# Patient Record
Sex: Male | Born: 1965 | Race: White | Hispanic: No | Marital: Married | Smoking: Never smoker
Health system: Southern US, Community
[De-identification: ages and names within clinical notes are randomized; demographics above are authoritative.]

## PROBLEM LIST (undated history)

## (undated) HISTORY — PX: OTHER SURGICAL HISTORY: SHX169

---

## 1998-07-15 ENCOUNTER — Ambulatory Visit (HOSPITAL_BASED_OUTPATIENT_CLINIC_OR_DEPARTMENT_OTHER): Admission: RE | Admit: 1998-07-15 | Discharge: 1998-07-15 | Payer: Self-pay | Admitting: Orthopedic Surgery

## 2007-05-23 ENCOUNTER — Ambulatory Visit: Payer: Self-pay | Admitting: Internal Medicine

## 2007-09-27 ENCOUNTER — Ambulatory Visit: Payer: Self-pay | Admitting: Internal Medicine

## 2008-06-27 ENCOUNTER — Ambulatory Visit: Payer: Self-pay | Admitting: Internal Medicine

## 2008-09-17 ENCOUNTER — Ambulatory Visit: Payer: Self-pay | Admitting: Internal Medicine

## 2008-12-01 ENCOUNTER — Ambulatory Visit: Payer: Self-pay | Admitting: Internal Medicine

## 2009-10-21 ENCOUNTER — Ambulatory Visit: Payer: Self-pay | Admitting: Family Medicine

## 2011-10-13 ENCOUNTER — Ambulatory Visit: Payer: Self-pay | Admitting: Internal Medicine

## 2012-05-30 DIAGNOSIS — G8929 Other chronic pain: Secondary | ICD-10-CM

## 2012-05-30 DIAGNOSIS — M549 Dorsalgia, unspecified: Secondary | ICD-10-CM | POA: Insufficient documentation

## 2012-12-04 DIAGNOSIS — Z79891 Long term (current) use of opiate analgesic: Secondary | ICD-10-CM | POA: Insufficient documentation

## 2013-04-29 DIAGNOSIS — E7800 Pure hypercholesterolemia, unspecified: Secondary | ICD-10-CM | POA: Insufficient documentation

## 2013-05-16 DIAGNOSIS — IMO0001 Reserved for inherently not codable concepts without codable children: Secondary | ICD-10-CM | POA: Insufficient documentation

## 2013-05-16 DIAGNOSIS — Z0189 Encounter for other specified special examinations: Secondary | ICD-10-CM | POA: Insufficient documentation

## 2014-06-28 ENCOUNTER — Ambulatory Visit: Payer: Self-pay | Admitting: Family Medicine

## 2015-04-08 ENCOUNTER — Other Ambulatory Visit: Payer: Self-pay | Admitting: *Deleted

## 2015-04-08 ENCOUNTER — Ambulatory Visit (INDEPENDENT_AMBULATORY_CARE_PROVIDER_SITE_OTHER): Payer: 59

## 2015-04-08 ENCOUNTER — Encounter: Payer: Self-pay | Admitting: Podiatry

## 2015-04-08 ENCOUNTER — Encounter: Payer: Self-pay | Admitting: *Deleted

## 2015-04-08 ENCOUNTER — Ambulatory Visit (INDEPENDENT_AMBULATORY_CARE_PROVIDER_SITE_OTHER): Payer: 59 | Admitting: Podiatry

## 2015-04-08 VITALS — BP 139/80 | HR 70 | Resp 16 | Ht 70.0 in | Wt 174.0 lb

## 2015-04-08 DIAGNOSIS — M722 Plantar fascial fibromatosis: Secondary | ICD-10-CM | POA: Diagnosis not present

## 2015-04-08 MED ORDER — MELOXICAM 15 MG PO TABS: 15.0000 mg | ORAL_TABLET | Freq: Every day | ORAL | Status: DC

## 2015-04-08 MED ORDER — METHYLPREDNISOLONE 4 MG PO TBPK: ORAL_TABLET | ORAL | Status: DC

## 2015-04-08 NOTE — Progress Notes (Signed)
   Subjective:    Patient ID: Austin Crawford, male    DOB: Jul 04, 1966, 49 y.o.   MRN: [id number]  HPI Been having pain in both my feet for a few months now. They just stay real sore   Review of Systems     Objective:   Physical Exam: I have reviewed his past medical history medications allergies surgery social history and review of systems. Pulses are strongly palpable bilateral. Neurologic sensorium is intact per Semmes-Weinstein monofilament. Deep tendon reflexes are intact bilateral and muscle strength +5 over 5 dorsiflexors plantarflexion inversion everters all the musculature is intact. Orthopedic evaluation demonstrates all joints distal to the ankle for range of motion without crepitation. He has pain on palpation medial calcaneal tubercles bilateral. Radiographs confirm soft tissue increase in density at the plantar fascial calcaneal insertion site otherwise they appear unremarkable.        Assessment & Plan:  Assessment: Plantar fasciitis chronic in nature bilateral.  Plan: Injected the bilateral heels today with Kenalog and local anesthesia. We discussed the etiology pathology conservative versus surgical therapies. We also discussed appropriate shoe gear stretching exercises ice therapy and shoe gear modifications. We dispensed a prescription for Medrol Dosepak to be followed by meloxicam. We also dispensed to plantar fascial braces and a single night splint. He was scant before he left the office today for orthotics. I will follow up with him once his orthotics come in. He will call sooner if needed.

## 2015-04-08 NOTE — Patient Instructions (Signed)

## 2015-05-11 ENCOUNTER — Ambulatory Visit: Payer: 59 | Admitting: Podiatry

## 2015-05-18 ENCOUNTER — Ambulatory Visit (INDEPENDENT_AMBULATORY_CARE_PROVIDER_SITE_OTHER): Payer: 59 | Admitting: Podiatry

## 2015-05-18 ENCOUNTER — Encounter: Payer: Self-pay | Admitting: Podiatry

## 2015-05-18 VITALS — BP 122/81 | HR 67 | Resp 16

## 2015-05-18 DIAGNOSIS — M722 Plantar fascial fibromatosis: Secondary | ICD-10-CM | POA: Diagnosis not present

## 2015-05-18 NOTE — Progress Notes (Signed)
He presents today for follow-up of his plantar fasciitis. He states that he is approximately 80% improved. He states that they are still painful at some point during the day but nothing like they were. He even relates going to the [facility] and walking in sand without any problems. He also would like to pick up his orthotics today.  Objective: Vital signs are stable alert and oriented 3. Pulses are palpable bilateral. He has minimal to no pain on palpation medial calcaneal tubercles bilateral.  Assessment: Well-healing plantar fasciitis 80% improved bilateral foot.  Plan: Dispensed orthotics today suggest that he continue all conservative therapies at the completely well follow up with me in 1 month to reevaluate her orthotics.

## 2015-05-18 NOTE — Patient Instructions (Signed)

## 2015-07-01 ENCOUNTER — Ambulatory Visit (INDEPENDENT_AMBULATORY_CARE_PROVIDER_SITE_OTHER): Payer: Managed Care, Other (non HMO) | Admitting: Podiatry

## 2015-07-01 DIAGNOSIS — M722 Plantar fascial fibromatosis: Secondary | ICD-10-CM

## 2015-07-01 NOTE — Progress Notes (Signed)
He presents today for follow-up of his orthotics in his plantar fasciitis. He states that the plantar fasciitis has completely resolved. He states that he is still having a hard time wearing the orthotics. He states that the right one seems to be bothering him the most. He has not worn them one full day since he purchased them.  Objective: Vital signs are stable he is alert and oriented 3 no pain on palpation medial calcaneal tubercles bilateral pain and pulses remain palpable. Evaluation of the orthotics demonstrates that they appear to be spot on without any adjustment needed.  Assessment: Well-healing plantar fasciitis.  Plan: I encouraged him to try to wear the orthotics on a regular basis. I suggested that he wear them in his tennis shoes every day particularly when he gets home from work. I will follow-up with him in a month if necessary.

## 2015-08-05 ENCOUNTER — Ambulatory Visit: Payer: Managed Care, Other (non HMO) | Admitting: Podiatry

## 2015-09-07 ENCOUNTER — Ambulatory Visit (INDEPENDENT_AMBULATORY_CARE_PROVIDER_SITE_OTHER): Payer: Managed Care, Other (non HMO) | Admitting: Podiatry

## 2015-09-07 ENCOUNTER — Encounter: Payer: Self-pay | Admitting: Podiatry

## 2015-09-07 VITALS — BP 133/87 | HR 73 | Resp 16

## 2015-09-07 DIAGNOSIS — M722 Plantar fascial fibromatosis: Secondary | ICD-10-CM | POA: Diagnosis not present

## 2015-09-07 NOTE — Progress Notes (Signed)
He presents today for follow-up of plantar fasciitis to his left heel. He states that he has had 1 injection bilateral in his right foot is fine his left foot is still painful with numbness and tingling to the forefoot.  Objective: Vital signs are stable he is alert and oriented 3. Pulses are palpable. He has pain on palpation medial calcaneal tubercle of the left heel.  Assessment: Plantar fasciitis left heel resolved plantar fasciitis right heel.  Plan: I reinjected today with Kenalog and local anesthetic and I will follow-up with him in 1 month at which time we may need to consider physical therapy.

## 2015-10-07 ENCOUNTER — Ambulatory Visit: Payer: Managed Care, Other (non HMO) | Admitting: Podiatry

## 2017-03-13 ENCOUNTER — Ambulatory Visit
Admission: EM | Admit: 2017-03-13 | Discharge: 2017-03-13 | Disposition: A | Discharge status: Home / Self Care | Payer: Commercial Managed Care - PPO | Attending: Family Medicine | Admitting: Family Medicine

## 2017-03-13 DIAGNOSIS — S46911A Strain of unspecified muscle, fascia and tendon at shoulder and upper arm level, right arm, initial encounter: Secondary | ICD-10-CM

## 2017-03-13 MED ORDER — HYDROCODONE-ACETAMINOPHEN 5-325 MG PO TABS: ORAL_TABLET | ORAL | 0 refills | Status: DC

## 2017-03-13 MED ORDER — IBUPROFEN 800 MG PO TABS: 800.0000 mg | ORAL_TABLET | Freq: Three times a day (TID) | ORAL | 0 refills | Status: AC

## 2017-03-13 NOTE — ED Triage Notes (Addendum)
Pt c/o right shoulder pain, he was carrying a bag of cement today and he dropped it and went to catch it and pulled something in his shoulder. Not really in pain but feels like something is misplaced when he lifts it.

## 2017-03-13 NOTE — ED Provider Notes (Signed)
[facility]    CSN: [id number] Arrival date & time: 03/13/17  1440     History   Chief Complaint Chief Complaint  Patient presents with  . Shoulder Pain    HPI Austin Crawford is a 51 y.o. male.   51 yo male with a c/o right shoulder and biceps pain after injuring it today. States went to catch a bag of cement when he felt something pull and pain.    The history is provided by the patient.  Shoulder Pain    History reviewed. No pertinent past medical history.  Patient Active Problem List   Diagnosis Date Noted  . Diagnostic skin and sensitization tests 05/16/2013  . Hypercholesteremia 04/29/2013  . Long term current use of opiate analgesic 12/04/2012  . Back pain, chronic 05/30/2012    Past Surgical History:  Procedure Laterality Date  . shoudler Left        Home Medications    Prior to Admission medications   Medication Sig Start Date End Date Taking? Authorizing Provider  HYDROcodone-acetaminophen (NORCO/VICODIN) 5-325 MG tablet 1-2 tabs po bid prn 03/13/17   Norval Gable, MD  ibuprofen (ADVIL,MOTRIN) 800 MG tablet Take 1 tablet (800 mg total) by mouth 3 (three) times daily. 03/13/17   Norval Gable, MD    Family History History reviewed. No pertinent family history.  Social History Social History  Substance Use Topics  . Smoking status: Never Smoker  . Smokeless tobacco: Never Used  . Alcohol use No     Allergies   Patient has no known allergies.   Review of Systems Review of Systems   Physical Exam Triage Vital Signs ED Triage Vitals  Enc Vitals Group     BP 03/13/17 1529 (!) 139/93     Pulse Rate 03/13/17 1529 81     Resp 03/13/17 1529 18     Temp 03/13/17 1529 98.2 F (36.8 C)     Temp Source 03/13/17 1529 Oral     SpO2 03/13/17 1529 99 %     Weight 03/13/17 1530 175 lb (79.4 kg)     Height 03/13/17 1530 5\' 10"  (1.778 m)     Head Circumference --      Peak Flow --      Pain Score 03/13/17 1530 0     Pain Loc  --      Pain Edu? --      Excl. in [place]? --    No data found.   Updated Vital Signs BP (!) 139/93 (BP Location: Left Arm)   Pulse 81   Temp 98.2 F (36.8 C) (Oral)   Resp 18   Ht 5\' 10"  (1.778 m)   Wt 175 lb (79.4 kg)   SpO2 99%   BMI 25.11 kg/m   Visual Acuity Right Eye Distance:   Left Eye Distance:   Bilateral Distance:    Right Eye Near:   Left Eye Near:    Bilateral Near:     Physical Exam  Constitutional: He appears well-developed and well-nourished. No distress.  Musculoskeletal:       Right shoulder: He exhibits tenderness (over the deltoid and biceps muscles). He exhibits normal range of motion, no bony tenderness, no swelling, no effusion, no crepitus, no deformity, no laceration, no spasm, normal pulse and normal strength.  Right upper extremity neurovascularly intact  Skin: He is not diaphoretic.  Vitals reviewed.    UC Treatments / Results  Labs (all labs ordered are listed, but only abnormal  results are displayed) Labs Reviewed - No data to display  EKG  EKG Interpretation None       Radiology No results found.  Procedures Procedures (including critical care time)  Medications Ordered in UC Medications - No data to display   Initial Impression / Assessment and Plan / UC Course  I have reviewed the triage vital signs and the nursing notes.  Pertinent labs & imaging results that were available during my care of the patient were reviewed by me and considered in my medical decision making (see chart for details).       Final Clinical Impressions(s) / UC Diagnoses   Final diagnoses:  Strain of right shoulder, initial encounter    New Prescriptions Discharge Medication List as of 03/13/2017  4:34 PM    START taking these medications   Details  HYDROcodone-acetaminophen (NORCO/VICODIN) 5-325 MG tablet 1-2 tabs po bid prn, Print       1. diagnosis reviewed with patient 2. rx as per orders above; reviewed possible side effects,  interactions, risks and benefits  3. Recommend supportive treatment with rest, ice, elevation, over the counter analgesics  4. Follow-up prn if symptoms worsen or don't improve   Norval Gable, MD 03/13/17 1721

## 2017-03-27 ENCOUNTER — Other Ambulatory Visit: Payer: Self-pay | Admitting: Orthopedic Surgery

## 2017-03-27 DIAGNOSIS — M25512 Pain in left shoulder: Secondary | ICD-10-CM

## 2017-03-29 ENCOUNTER — Ambulatory Visit
Admission: RE | Admit: 2017-03-29 | Discharge: 2017-03-29 | Disposition: A | Discharge status: Home / Self Care | Payer: Commercial Managed Care - PPO | Source: Ambulatory Visit | Attending: Orthopedic Surgery | Admitting: Orthopedic Surgery

## 2017-03-29 ENCOUNTER — Other Ambulatory Visit: Payer: Self-pay | Admitting: Orthopedic Surgery

## 2017-03-29 DIAGNOSIS — M25512 Pain in left shoulder: Secondary | ICD-10-CM

## 2017-03-29 DIAGNOSIS — M25511 Pain in right shoulder: Secondary | ICD-10-CM

## 2017-05-12 ENCOUNTER — Ambulatory Visit
Admission: EM | Admit: 2017-05-12 | Discharge: 2017-05-12 | Disposition: A | Discharge status: Home / Self Care | Payer: Commercial Managed Care - PPO | Attending: Family Medicine | Admitting: Family Medicine

## 2017-05-12 ENCOUNTER — Encounter: Payer: Self-pay | Admitting: *Deleted

## 2017-05-12 DIAGNOSIS — K644 Residual hemorrhoidal skin tags: Secondary | ICD-10-CM

## 2017-05-12 DIAGNOSIS — K648 Other hemorrhoids: Secondary | ICD-10-CM

## 2017-05-12 MED ORDER — TRIAMCINOLONE ACETONIDE 0.1 % EX CREA: 1.0000 "application " | TOPICAL_CREAM | Freq: Two times a day (BID) | CUTANEOUS | 0 refills | Status: AC

## 2017-05-12 MED ORDER — PREDNISONE 20 MG PO TABS: 20.0000 mg | ORAL_TABLET | Freq: Every day | ORAL | 0 refills | Status: DC

## 2017-05-12 NOTE — ED Provider Notes (Signed)
[facility]    CSN: [id number] Arrival date & time: 05/12/17  1218     History   Chief Complaint Chief Complaint  Patient presents with  . Hemorrhoids    HPI Austin Crawford is a 51 y.o. male.   52 yo male with a c/o painful hemorrhoid for one week, worse over the last 2 days. States has recently been taking pain medication intermittently during PT for his shoulder. Patient had recent shoulder surgery.    The history is provided by the patient.    History reviewed. No pertinent past medical history.  Patient Active Problem List   Diagnosis Date Noted  . Diagnostic skin and sensitization tests 05/16/2013  . Hypercholesteremia 04/29/2013  . Long term current use of opiate analgesic 12/04/2012  . Back pain, chronic 05/30/2012    Past Surgical History:  Procedure Laterality Date  . shoudler Left        Home Medications    Prior to Admission medications   Medication Sig Start Date End Date Taking? Authorizing Provider  HYDROcodone-acetaminophen (NORCO/VICODIN) 5-325 MG tablet 1-2 tabs po bid prn 03/13/17   Norval Gable, MD  ibuprofen (ADVIL,MOTRIN) 800 MG tablet Take 1 tablet (800 mg total) by mouth 3 (three) times daily. 03/13/17   Norval Gable, MD  predniSONE (DELTASONE) 20 MG tablet Take 1 tablet (20 mg total) by mouth daily. 05/12/17   Norval Gable, MD  triamcinolone cream (KENALOG) 0.1 % Apply 1 application topically 2 (two) times daily. 05/12/17   Norval Gable, MD    Family History History reviewed. No pertinent family history.  Social History Social History  Substance Use Topics  . Smoking status: Never Smoker  . Smokeless tobacco: Never Used  . Alcohol use No     Allergies   Patient has no known allergies.   Review of Systems Review of Systems   Physical Exam Triage Vital Signs ED Triage Vitals  Enc Vitals Group     BP 05/12/17 1234 (!) 131/95     Pulse Rate 05/12/17 1234 85     Resp 05/12/17 1234 16     Temp 05/12/17  1234 98 F (36.7 C)     Temp Source 05/12/17 1234 Oral     SpO2 05/12/17 1234 100 %     Weight 05/12/17 1235 175 lb (79.4 kg)     Height 05/12/17 1235 5\' 8"  (1.727 m)     Head Circumference --      Peak Flow --      Pain Score 05/12/17 1236 7     Pain Loc --      Pain Edu? --      Excl. in [place]? --    No data found.   Updated Vital Signs BP (!) 131/95 (BP Location: Left Arm)   Pulse 85   Temp 98 F (36.7 C) (Oral)   Resp 16   Ht 5\' 8"  (1.727 m)   Wt 175 lb (79.4 kg)   SpO2 100%   BMI 26.61 kg/m   Visual Acuity Right Eye Distance:   Left Eye Distance:   Bilateral Distance:    Right Eye Near:   Left Eye Near:    Bilateral Near:     Physical Exam  Constitutional: He appears well-developed and well-nourished. No distress.  Abdominal: Soft. Bowel sounds are normal. He exhibits no distension and no mass. There is no tenderness. There is no rebound and no guarding. No hernia.  Genitourinary: Rectal exam shows external hemorrhoid (inflamed  external hemorrhoid).  Skin: He is not diaphoretic.  Nursing note and vitals reviewed.    UC Treatments / Results  Labs (all labs ordered are listed, but only abnormal results are displayed) Labs Reviewed - No data to display  EKG  EKG Interpretation None       Radiology No results found.  Procedures Procedures (including critical care time)  Medications Ordered in UC Medications - No data to display   Initial Impression / Assessment and Plan / UC Course  I have reviewed the triage vital signs and the nursing notes.  Pertinent labs & imaging results that were available during my care of the patient were reviewed by me and considered in my medical decision making (see chart for details).       Final Clinical Impressions(s) / UC Diagnoses   Final diagnoses:  External hemorrhoid    New Prescriptions Discharge Medication List as of 05/12/2017 12:59 PM    START taking these medications   Details  predniSONE  (DELTASONE) 20 MG tablet Take 1 tablet (20 mg total) by mouth daily., Starting Fri 05/12/2017, Normal    triamcinolone cream (KENALOG) 0.1 % Apply 1 application topically 2 (two) times daily., Starting Fri 05/12/2017, Normal       1. diagnosis reviewed with patient 2. rx as per orders above; reviewed possible side effects, interactions, risks and benefits  3. Recommend supportive treatment with sitz baths, pressure relief with donut cushion, increased water and fiber  4. Follow-up prn if symptoms worsen or don't improve   Norval Gable, MD 05/12/17 1345

## 2017-05-12 NOTE — ED Triage Notes (Signed)
Painful hemorrhoids x1 week.

## 2017-05-16 ENCOUNTER — Telehealth: Payer: Self-pay

## 2017-05-16 ENCOUNTER — Telehealth: Payer: Self-pay | Admitting: Emergency Medicine

## 2017-05-16 NOTE — Telephone Encounter (Signed)
Patient called in requesting GI referral. Advised patient he would need to established with a PCP for referral. Patient stated he would  give the healing process of his hemorrhoids  a couple more days before contacting [facility] to set up appointment.

## 2017-07-12 ENCOUNTER — Encounter: Payer: Self-pay | Admitting: Podiatry

## 2017-07-12 ENCOUNTER — Ambulatory Visit (INDEPENDENT_AMBULATORY_CARE_PROVIDER_SITE_OTHER): Payer: Commercial Managed Care - PPO

## 2017-07-12 ENCOUNTER — Ambulatory Visit (INDEPENDENT_AMBULATORY_CARE_PROVIDER_SITE_OTHER): Payer: Commercial Managed Care - PPO | Admitting: Podiatry

## 2017-07-12 DIAGNOSIS — M722 Plantar fascial fibromatosis: Secondary | ICD-10-CM

## 2017-07-12 MED ORDER — METHYLPREDNISOLONE 4 MG PO TBPK: ORAL_TABLET | ORAL | 0 refills | Status: DC

## 2017-07-12 MED ORDER — MELOXICAM 15 MG PO TABS: 15.0000 mg | ORAL_TABLET | Freq: Every day | ORAL | 3 refills | Status: DC

## 2017-07-13 NOTE — Progress Notes (Signed)
He presents today after having not seen him for the past couple of years with a chief complaint of pain to his right heel. He states this heel has been hurt me so bad again for the past 2 weeks. He denies any trauma.  Objective: I have reviewed his past medical history medications allergy surgery social history and review of systems. Pulses are strongly palpable vital signs are stable he is alert and oriented 3. He is in no apparent distress. Pulses are strongly palpable neurologic sensorium is intact deep tendon reflexes are intact muscle strength is symmetrical equal bilateral. He has no pain on medial and lateral compression of the calcaneus that he does have pain on direct palpation medial calcaneal tubercle of the right heel which is exquisitely tender in nature. Radiographs taken in the office today 3 views right foot demonstrates an osseous immature individual with no significant osseous findings other than a soft tissue increase in density at the plantar fascial calcaneal insertion site.  Assessment: Moderate severe plantar fasciitis right heel.  Plan: Discussed etiology pathology conservative versus surgical therapies. At this point injected his right heel today placed in a plantar fascial brace and a night splint. Discussed appropriate shoe gear stretching exercises ice therapy and shoe gear modifications. I also started him on a Medrol Dosepak to be followed by meloxicam. I will follow-up with him in 1 month should he have questions or concerns he will notify us immediately.

## 2017-07-14 ENCOUNTER — Telehealth: Payer: Self-pay | Admitting: *Deleted

## 2017-07-14 NOTE — Telephone Encounter (Signed)
Pt left name and birthdate. 07/14/2017-I spoke with pt and he said someone called "it" in. I also gave pt ice instructions 3-4 times a day for 15-20 minutes protecting skin from the ice pack with fabric that would help with inflammation, pain and swelling.

## 2017-08-02 ENCOUNTER — Ambulatory Visit (INDEPENDENT_AMBULATORY_CARE_PROVIDER_SITE_OTHER): Payer: Commercial Managed Care - PPO | Admitting: Podiatry

## 2017-08-02 ENCOUNTER — Encounter: Payer: Self-pay | Admitting: Podiatry

## 2017-08-02 DIAGNOSIS — M722 Plantar fascial fibromatosis: Secondary | ICD-10-CM | POA: Diagnosis not present

## 2017-08-02 MED ORDER — DICLOFENAC SODIUM 75 MG PO TBEC: 75.0000 mg | DELAYED_RELEASE_TABLET | Freq: Two times a day (BID) | ORAL | 3 refills | Status: AC

## 2017-08-02 NOTE — Progress Notes (Signed)
He presents today for follow-up of his plantar fasciitis bilaterally. States that the right heel something bothering him. He states his Y view 60% improved. Not sure whether meloxicam is helping or not.  Objective: Vital signs are stable he is alert and oriented 3 pulses are palpable. Pain on palpation medial calcaneal tubercle of the right heel.  Assessment: Resolving plantar fasciitis right.  Plan: Reinjected the right heel today started him on diclofenac 75 mg 1 by mouth twice a day 60 #3 refills. Follow-up with in one month continue her orthotics regularly. Discussed prep issue yesterday size ice therapy shoe gear modifications and new boots.

## 2017-09-04 ENCOUNTER — Ambulatory Visit: Payer: Commercial Managed Care - PPO | Admitting: Podiatry

## 2017-09-04 ENCOUNTER — Encounter: Payer: Self-pay | Admitting: Podiatry

## 2017-09-04 DIAGNOSIS — M722 Plantar fascial fibromatosis: Secondary | ICD-10-CM | POA: Diagnosis not present

## 2017-09-04 NOTE — Progress Notes (Signed)
He presents today for follow-up of his plantar fasciitis. He states that his right heel still really hurts.  Objective: Vital signs are stable he is alert and oriented 3. Pulses are palpable. Pain on palpation medially into the right heel.  Assessment: Plantar fascitis right foot.  Plan: Reinjected the third dose of cortisone to the right heel today. If this is not improved in 3-4 weeks and we will consider MRI for surgical intervention.

## 2017-09-11 ENCOUNTER — Telehealth: Payer: Self-pay | Admitting: Podiatry

## 2017-09-11 DIAGNOSIS — M79671 Pain in right foot: Secondary | ICD-10-CM

## 2017-09-11 NOTE — Telephone Encounter (Signed)
I'm a pt of Dr. Stephenie Acres and he told me if the third injection did not help, that he could get an MRI set up. My foot is still bothering me after the last shot so I would like to go ahead and get an MRI set up for the new year to get that out of the way. You can reach me at [phone]. Thank you.

## 2017-09-12 NOTE — Addendum Note (Signed)
Addended by: Graceann Congress D on: 09/12/2017 11:11 AM   Modules accepted: Orders

## 2017-09-21 ENCOUNTER — Other Ambulatory Visit: Payer: Self-pay

## 2017-09-21 DIAGNOSIS — M722 Plantar fascial fibromatosis: Secondary | ICD-10-CM

## 2017-09-21 NOTE — Telephone Encounter (Signed)
Per Crystal with [employer], MRI rt heel has been approved from 09/21/17 to 10/23/17 Auth # [id number] Patient has been notified and he will call scheduling to set up his appt.

## 2017-09-26 ENCOUNTER — Ambulatory Visit
Admission: RE | Admit: 2017-09-26 | Discharge: 2017-09-26 | Disposition: A | Discharge status: Home / Self Care | Payer: Commercial Managed Care - PPO | Source: Ambulatory Visit | Attending: Podiatry | Admitting: Podiatry

## 2017-09-26 DIAGNOSIS — M79671 Pain in right foot: Secondary | ICD-10-CM

## 2017-09-26 DIAGNOSIS — M65871 Other synovitis and tenosynovitis, right ankle and foot: Secondary | ICD-10-CM | POA: Diagnosis not present

## 2017-09-28 ENCOUNTER — Other Ambulatory Visit: Payer: Self-pay

## 2017-09-28 MED ORDER — TRAMADOL HCL 50 MG PO TABS: ORAL_TABLET | ORAL | 0 refills | Status: AC

## 2017-09-28 NOTE — Progress Notes (Signed)
Informed patient of delay of MRI results and reasons for delay. Faxed over request for copy of CD to send out to [facility] from [facility]. Patient requested something for pain management, per Dr Milinda Pointer tramadol was written. Patient will be scheduled once we receive report from [facility]

## 2017-10-04 ENCOUNTER — Telehealth: Payer: Self-pay | Admitting: Podiatry

## 2017-10-04 ENCOUNTER — Ambulatory Visit: Payer: Commercial Managed Care - PPO | Admitting: Podiatry

## 2017-10-04 NOTE — Telephone Encounter (Addendum)
Receptionist - [facility] states has not received the disc. Faxed 2nd request for copy of MRI disc. 10/05/2017-Copy of MRI disc received then mailed to [facility].

## 2017-10-04 NOTE — Telephone Encounter (Signed)
Patient called today to see if Dr. Milinda Pointer has his over read report back from [facility]?  MRI was done at [facility] on 09/26/17. [facility] they have no record of ever getting this request for an overread.  Per Dr. Milinda Pointer, He would like Austin Crawford to resend the request asap for the overread.

## 2017-10-11 ENCOUNTER — Encounter: Payer: Self-pay | Admitting: Podiatry

## 2017-10-12 NOTE — Progress Notes (Signed)
Left vm on Dec 20th to schedule apt. 12/20/18CS

## 2017-10-20 NOTE — Progress Notes (Signed)
Left vm to schedule an apt on 12/28/18CS

## 2017-10-25 ENCOUNTER — Encounter: Payer: Self-pay | Admitting: Podiatry

## 2017-10-25 ENCOUNTER — Ambulatory Visit: Payer: Commercial Managed Care - PPO | Admitting: Podiatry

## 2017-10-25 DIAGNOSIS — M722 Plantar fascial fibromatosis: Secondary | ICD-10-CM

## 2017-10-25 NOTE — Patient Instructions (Signed)
Pre-Operative Instructions  Congratulations, you have decided to take an important step towards improving your quality of life.  You can be assured that the doctors and staff at [facility] will be with you every step of the way.  Here are some important things you should know:  1. Plan to be at the surgery center/hospital at least 1 (one) hour prior to your scheduled time, unless otherwise directed by the surgical center/hospital staff.  You must have a responsible adult accompany you, remain during the surgery and drive you home.  Make sure you have directions to the surgical center/hospital to ensure you arrive on time. 2. If you are having surgery at [facility] or [facility], you will need a copy of your medical history and physical form from your family physician within one month prior to the date of surgery. We will give you a form for your primary physician to complete.  3. We make every effort to accommodate the date you request for surgery.  However, there are times where surgery dates or times have to be moved.  We will contact you as soon as possible if a change in schedule is required.   4. No aspirin/ibuprofen for one week before surgery.  If you are on aspirin, any non-steroidal anti-inflammatory medications (Mobic, Aleve, Ibuprofen) should not be taken seven (7) days prior to your surgery.  You make take Tylenol for pain prior to surgery.  5. Medications - If you are taking daily heart and blood pressure medications, seizure, reflux, allergy, asthma, anxiety, pain or diabetes medications, make sure you notify the surgery center/hospital before the day of surgery so they can tell you which medications you should take or avoid the day of surgery. 6. No food or drink after midnight the night before surgery unless directed otherwise by surgical center/hospital staff. 7. No alcoholic beverages 24-hours prior to surgery.  No smoking 24-hours prior or 24-hours after  surgery. 8. Wear loose pants or shorts. They should be loose enough to fit over bandages, boots, and casts. 9. Don't wear slip-on shoes. Sneakers are preferred. 10. Bring your boot with you to the surgery center/hospital.  Also bring crutches or a walker if your physician has prescribed it for you.  If you do not have this equipment, it will be provided for you after surgery. 11. If you have not been contacted by the surgery center/hospital by the day before your surgery, call to confirm the date and time of your surgery. 12. Leave-time from work may vary depending on the type of surgery you have.  Appropriate arrangements should be made prior to surgery with your employer. 13. Prescriptions will be provided immediately following surgery by your doctor.  Fill these as soon as possible after surgery and take the medication as directed. Pain medications will not be refilled on weekends and must be approved by the doctor. 14. Remove nail polish on the operative foot and avoid getting pedicures prior to surgery. 15. Wash the night before surgery.  The night before surgery wash the foot and leg well with water and the antibacterial soap provided. Be sure to pay special attention to beneath the toenails and in between the toes.  Wash for at least three (3) minutes. Rinse thoroughly with water and dry well with a towel.  Perform this wash unless told not to do so by your physician.  Enclosed: 1 Ice pack (please put in freezer the night before surgery)   1 Hibiclens skin cleaner     Pre-op instructions  If you have any questions regarding the instructions, please do not hesitate to call our office.  [place]: [address], , [place] [address] -- [phone]  [employer]: [address] [place], [place] [address] -- [phone]  [place]: [address]  [place], [place] [address] -- [phone]  [place]: [address], [address], [place], [place] [address] -- [phone]  Website:  [email or web address] 

## 2017-10-25 NOTE — Progress Notes (Signed)
He presents today for follow-up of plantar fasciitis to his right foot.  He states that is not getting any better I think is actually worse than it was before.  Objective: Vital signs are stable alert and oriented x3.  MRI states chronic proximal plantar fasciitis and inflammation.  Assessment: Plantar fasciitis chronic in nature right foot.  Plan: At this point we went over the consent form today line by line number by number given him ample time to ask questions he self-fed regarding a endoscopic plantar fasciotomy of his right heel.  I answered all the questions regarding this procedure to the best of my ability in layman's terms as well as questions regarding anesthesia and the surgical facility.  At this point we did discuss the possible postop complications which may include but are not limited to postop pain bleeding swelling infection recurrence need for further surgery overcorrection under correction.  I will follow-up with him somewhere in the future for surgical intervention.

## 2017-10-27 ENCOUNTER — Telehealth: Payer: Self-pay | Admitting: *Deleted

## 2017-10-27 NOTE — Telephone Encounter (Signed)
Pt states he is calling to make sure he has done everything to get ready for his surgery 11/03/2017. Pt states he waanted to know if he needed to do anything else for his disability for our office and get information about when to show up for the surgery. I told pt [facility] would contact him concerning surgery time, but if they had not called by the day before to call [phone]. Pt states understanding.

## 2017-10-27 NOTE — Telephone Encounter (Signed)
Close encounter 

## 2017-11-01 ENCOUNTER — Ambulatory Visit: Payer: Commercial Managed Care - PPO | Admitting: Podiatry

## 2017-11-02 ENCOUNTER — Other Ambulatory Visit: Payer: Self-pay | Admitting: Podiatry

## 2017-11-02 MED ORDER — CEPHALEXIN 500 MG PO CAPS: 500.0000 mg | ORAL_CAPSULE | Freq: Three times a day (TID) | ORAL | 0 refills | Status: DC

## 2017-11-02 MED ORDER — OXYCODONE-ACETAMINOPHEN 10-325 MG PO TABS: 1.0000 | ORAL_TABLET | ORAL | 0 refills | Status: DC | PRN

## 2017-11-02 MED ORDER — PROMETHAZINE HCL 25 MG PO TABS: 25.0000 mg | ORAL_TABLET | Freq: Three times a day (TID) | ORAL | 0 refills | Status: DC | PRN

## 2017-11-03 ENCOUNTER — Encounter: Payer: Self-pay | Admitting: Podiatry

## 2017-11-03 DIAGNOSIS — M722 Plantar fascial fibromatosis: Secondary | ICD-10-CM | POA: Diagnosis not present

## 2017-11-06 ENCOUNTER — Telehealth: Payer: Self-pay | Admitting: Podiatry

## 2017-11-06 ENCOUNTER — Telehealth: Payer: Self-pay | Admitting: *Deleted

## 2017-11-06 NOTE — Telephone Encounter (Signed)
I had surgery on Friday and I have a question. If someone would please give me a call back at [phone]. Thank you.

## 2017-11-06 NOTE — Telephone Encounter (Signed)
I spoke with patient and he stated he had already spoke with a physician in the [place] office.

## 2017-11-06 NOTE — Telephone Encounter (Signed)
POST OP CALL-  Called pt - pt states foot is still numb from the leg block, hasn't had any pain, so hasn't taken any pain meds, no tightness in chest or calf pain, confirmed 1st POV appt which he wanted to change to 9:30 from 3:45 on 1/16-will reschedule time.

## 2017-11-08 ENCOUNTER — Encounter: Payer: Self-pay | Admitting: Podiatry

## 2017-11-08 ENCOUNTER — Encounter: Payer: Commercial Managed Care - PPO | Admitting: Podiatry

## 2017-11-08 ENCOUNTER — Ambulatory Visit (INDEPENDENT_AMBULATORY_CARE_PROVIDER_SITE_OTHER): Payer: Commercial Managed Care - PPO | Admitting: Podiatry

## 2017-11-08 VITALS — BP 138/85 | HR 65 | Temp 98.5°F | Resp 16

## 2017-11-08 DIAGNOSIS — M722 Plantar fascial fibromatosis: Secondary | ICD-10-CM

## 2017-11-08 NOTE — Progress Notes (Signed)
He presents today for follow-up of his endoscopic plantar fasciotomy.  Date of surgery was November 03, 2017.  He states that his foot is still numb he really cannot feel the heel very much but is been wearing his cam walker to stay off the foot as much as possible.  Denies calf pain shortness of breath or chest pain.  Objective: Vital signs are stable alert and oriented x3.  Pulses are palpable.  No erythema edema cellulitis drainage or odor.  Sutures are intact margins well coapted there is no skin breakdown.  No tenderness on palpation of the plantar heel probably because is still numb.  Assessment: Well-healing surgical foot redressed today with Band-Aids to new the use of the Cam walker 24 hours a day for the next week.  Follow-up with me in 1 week

## 2017-11-15 ENCOUNTER — Encounter: Payer: Commercial Managed Care - PPO | Admitting: Podiatry

## 2017-11-15 ENCOUNTER — Encounter: Payer: Self-pay | Admitting: Podiatry

## 2017-11-15 ENCOUNTER — Ambulatory Visit (INDEPENDENT_AMBULATORY_CARE_PROVIDER_SITE_OTHER): Payer: Commercial Managed Care - PPO | Admitting: Podiatry

## 2017-11-15 DIAGNOSIS — M722 Plantar fascial fibromatosis: Secondary | ICD-10-CM

## 2017-11-15 NOTE — Progress Notes (Signed)
He presents today 2 weeks status post endoscopic plantar fasciotomy date of surgery is 11/03/2017.  He states that he feels fine is just sort of numb still on the toes in the middle part of my foot but otherwise is doing really well.  Denies fever chills nausea vomiting chest pain shortness of breath.  Objective: Presents today still utilizing the Cam walker.  Band-Aids removed demonstrates margins are well coapted.  No ecchymosis no pain on palpation medial calcaneal tubercle.  Sutures were removed today margins remain well coapted.  Assessment: Well-healing surgical foot.  Plan: Follow-up with me in 2 weeks I will allow him to start wearing a tennis shoe Friday and continue the use of the night splint or Cam walker at bedtime for the next month.  I will follow-up with him in 2 weeks

## 2017-11-27 ENCOUNTER — Encounter: Payer: Self-pay | Admitting: Podiatry

## 2017-11-27 ENCOUNTER — Ambulatory Visit (INDEPENDENT_AMBULATORY_CARE_PROVIDER_SITE_OTHER): Payer: Commercial Managed Care - PPO | Admitting: Podiatry

## 2017-11-27 DIAGNOSIS — M722 Plantar fascial fibromatosis: Secondary | ICD-10-CM

## 2017-11-27 NOTE — Progress Notes (Signed)
He presents today for follow-up of his endoscopic plantar fasciotomy of the right heel.  He states that is still hurting some but not nearly as bad as it was.  States that particular shoe gear seems to still bother it and he is trying to start driving and walking more so that it will simulate his work environment.  Objective: Vital signs are stable he is alert and oriented x3.  So has some swelling and tenderness at the incision sites but this should resolve.  There is mild erythema around the medial incision site of the right foot.  He has some tenderness on palpation medial calcaneal tubercle none on palpation of the Achilles.  No lateral pain.  Assessment: Resolving endoscopic plantar fasciotomy surgery.  Appears to be healing a little slowly but should a long-term complete recovery.  Plan: We will follow-up with him in the end of February to reassess.  In the meantime I provided him with stretching exercises and discussed appropriate shoe gear.

## 2017-11-29 ENCOUNTER — Ambulatory Visit: Payer: Commercial Managed Care - PPO | Admitting: Podiatry

## 2017-11-29 IMAGING — CT CT SHOULDER*R* W/O CM
2 series · 10 of 14 positions shown, 12 images · non-contrast
Comparison: MRI right shoulder 03/22/2017.

CLINICAL DATA: Status post fall 03/16/2017 with onset of right
shoulder pain. Preoperative examination. Subsequent encounter.

EXAM:
CT OF THE UPPER RIGHT EXTREMITY WITHOUT CONTRAST
TECHNIQUE: Multidetector CT imaging of the upper right extremity was performed
according to the standard protocol.

[Series 2: soft tissue · axial · 0.49mm/px · z∈[-202,-66]mm · 5 of 103 slices shown]
[im 18/103  soft-tissue]
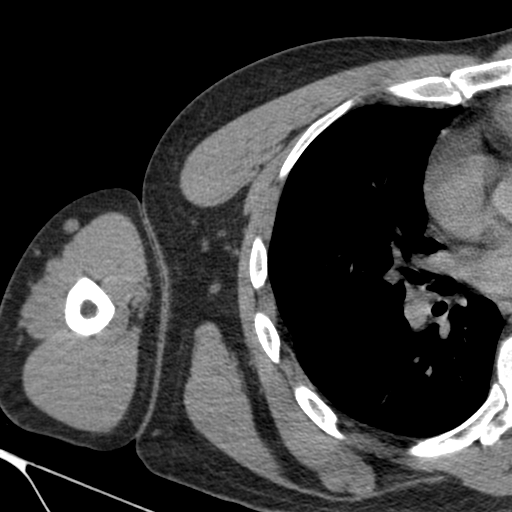
[im 35/103  soft-tissue]
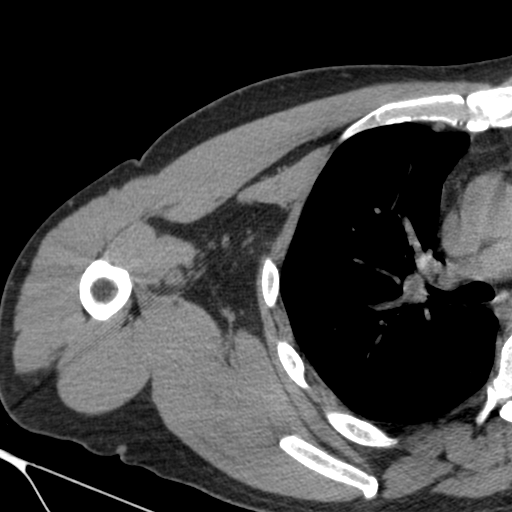
[im 52/103  soft-tissue]
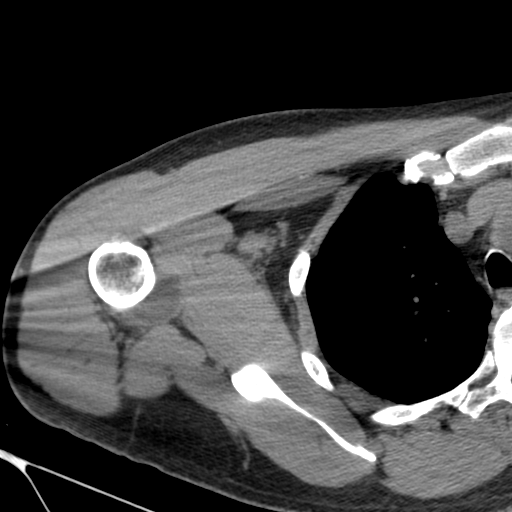
[im 69/103  soft-tissue]
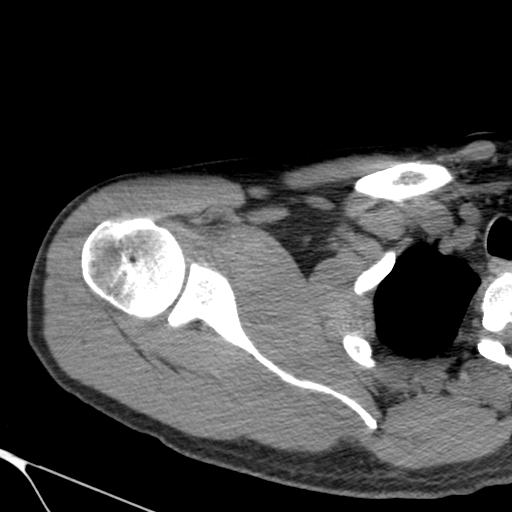
[im 86/103  soft-tissue]
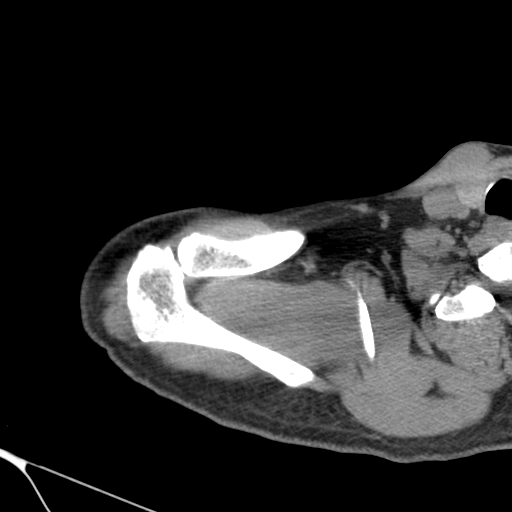

[Series 3: bone · axial · 0.49mm/px · z∈[-200,-66]mm · 5 of 101 slices shown, 7 images]
[im 17/101  soft-tissue]
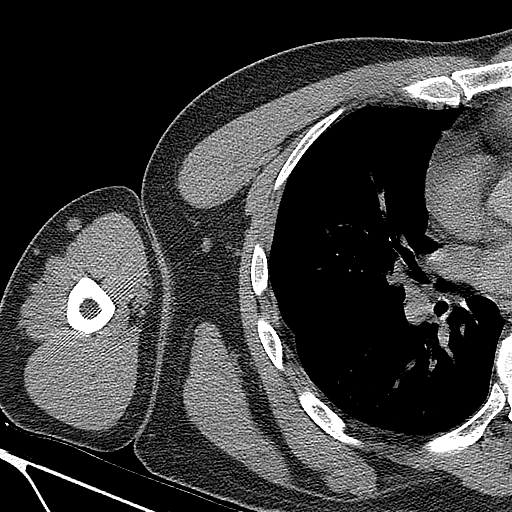
[im 17/101  bone]
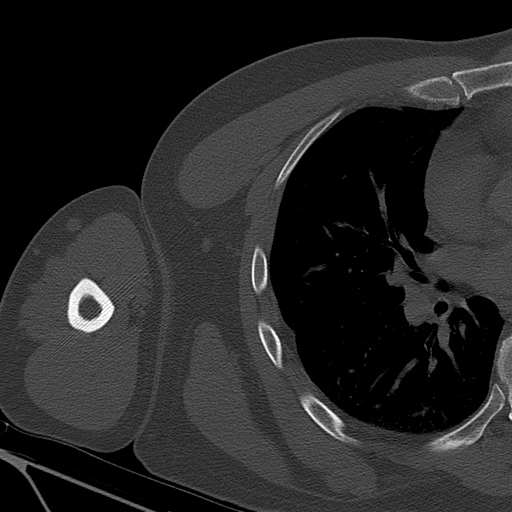
[im 34/101  bone]
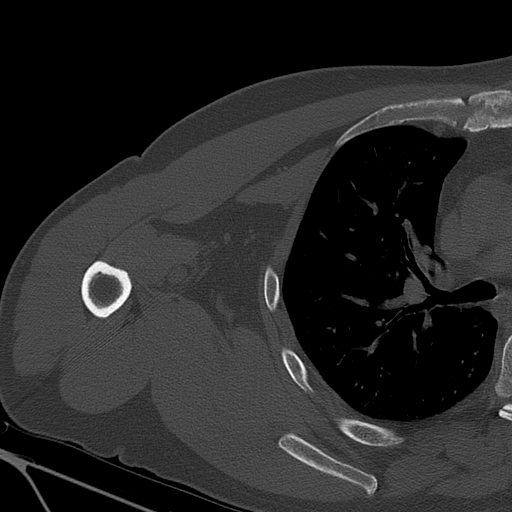
[im 51/101  bone]
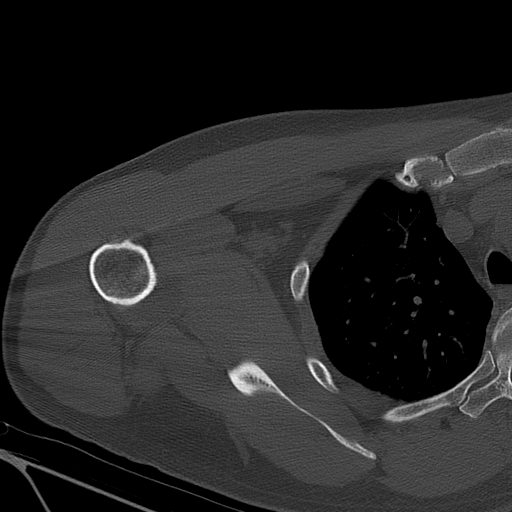
[im 67/101  bone]
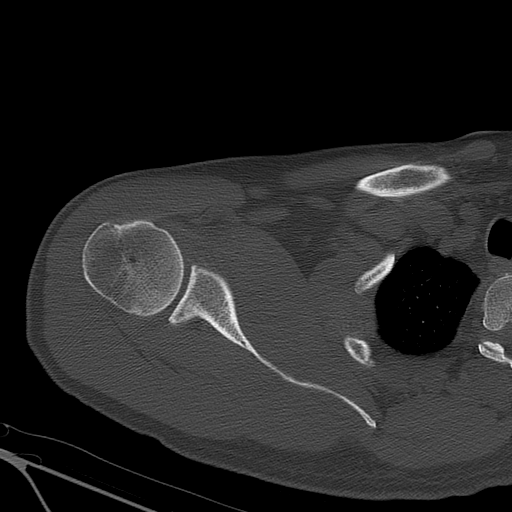
[im 84/101  soft-tissue]
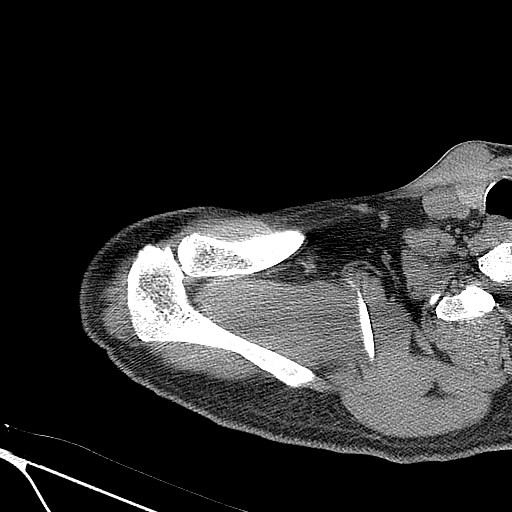
[im 84/101  bone]
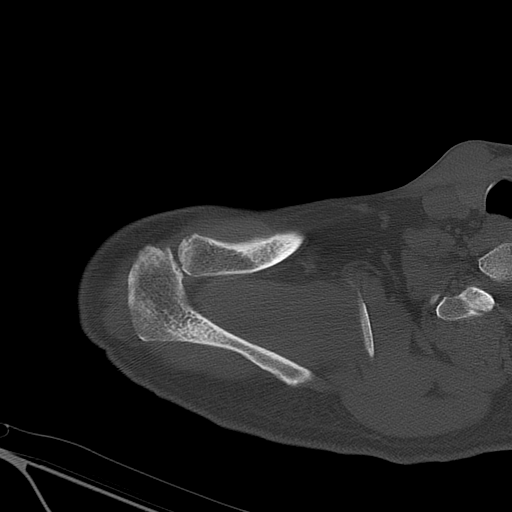

[10 of 14 positions shown; findings below may reference images not displayed]

FINDINGS: Bones/Joint/Cartilage

Extensive subchondral cyst formation is seen in the the anterior
glenoid. The patient has a nondisplaced bony Bankart with a fracture
fragment measuring up to 2.6 cm craniocaudal by 0.8 cm AP.
Subchondral cyst formation is deep to the nondisplaced fracture
fragment. The fracture fragment itself is markedly sclerotic. There
is mild flattening of the posterior, lateral aspect of the humeral
head consistent with a Hill-Sachs lesion. The lesion is barely
perceptible. The humerus is located. The patient has moderate
acromioclavicular osteoarthritis. The acromion is type 1.

Ligaments

Suboptimally assessed by CT.

Muscles and Tendons

Intact and unremarkable.

Soft tissues

Negative.  Imaged lung parenchyma is clear.
IMPRESSION: Nondisplaced, large bony Bankart with extensive subchondral cyst
formation subjacent to the lesion. The Bankart fracture fragment is
markedly sclerotic suggesting necrosis. Findings are consistent with
remote injury given subchondral cyst formation and sclerosis.

Hill-Sachs lesion seen on MRI is barely perceptible on CT.

Moderate acromioclavicular osteoarthritis.

3-dimensional CT images were rendered by post-processing of the
original CT data at the CT scanner. The 3-dimensional CT images were
interpreted, and findings were reported in the accompanying complete
CT report for this study.

## 2017-12-20 ENCOUNTER — Encounter: Payer: Self-pay | Admitting: Podiatry

## 2017-12-20 ENCOUNTER — Ambulatory Visit (INDEPENDENT_AMBULATORY_CARE_PROVIDER_SITE_OTHER): Payer: Commercial Managed Care - PPO | Admitting: Podiatry

## 2017-12-20 DIAGNOSIS — M722 Plantar fascial fibromatosis: Secondary | ICD-10-CM

## 2017-12-20 NOTE — Progress Notes (Signed)
He presents today for his third postop visit date of surgery November 03, 2017 endoscopic fasciotomy right.  He states that he is doing pretty well after he gets going in the mornings he says it takes about 15-20 minutes for his foot to feel better and he has not been wearing his boot at night.  He states that after he gets going it feels better for a while and then starts to become painful again.  Objective: Vital signs are stable he is alert and oriented x3.  Pulses are palpable.  He still has pain to palpation medial calcaneal tubercle of the right heel.  There is no erythema cellulitis drainage or odor.  Assessment: Plantar fasciitis resolving after endoscopic plantar fasciotomy just a little slower than average individual to heal.  Plan: We will recommend he start more of an aggressive physical therapy and he will probably remain out of work my guess would be at least another 3-4 weeks.

## 2018-01-08 ENCOUNTER — Encounter: Payer: Self-pay | Admitting: Podiatry

## 2018-01-08 ENCOUNTER — Ambulatory Visit (INDEPENDENT_AMBULATORY_CARE_PROVIDER_SITE_OTHER): Payer: Commercial Managed Care - PPO | Admitting: Podiatry

## 2018-01-08 DIAGNOSIS — M722 Plantar fascial fibromatosis: Secondary | ICD-10-CM

## 2018-01-08 NOTE — Progress Notes (Signed)
He presents today for follow-up of his endoscopic plantar fasciotomy November 03, 2017 right foot.  States that seems to be doing better but these orthotics are really give me a hard time.  I know I need to wear the orthotics for my work so we will get this again but I am having a hard time getting used to them.  Objective: Vital signs are stable he is alert and oriented x3.  Pulses are palpable.  Neurologic sensorium is intact deep tendon reflexes are intact muscle strength is normal symmetrical.  No reproducible pain on palpation medial calcaneal tubercle of the right heel.  Some mild edema.  Evaluation of the orthotics demonstrates that there appear to be fitting normally.  Assessment: Resolving endoscopic plantar fasciotomy November 03, 2017.  Plan: Encourage further further physical therapy as well as continue use of the orthotics before returning to work at the end of this month.  He will return to work March 29.  I will follow-up with him in 2-4 weeks.

## 2018-02-05 ENCOUNTER — Encounter: Payer: Commercial Managed Care - PPO | Admitting: Podiatry

## 2018-05-16 ENCOUNTER — Ambulatory Visit (INDEPENDENT_AMBULATORY_CARE_PROVIDER_SITE_OTHER): Payer: Commercial Managed Care - PPO

## 2018-05-16 ENCOUNTER — Encounter: Payer: Self-pay | Admitting: Podiatry

## 2018-05-16 ENCOUNTER — Ambulatory Visit: Payer: Commercial Managed Care - PPO | Admitting: Podiatry

## 2018-05-16 DIAGNOSIS — M722 Plantar fascial fibromatosis: Secondary | ICD-10-CM | POA: Diagnosis not present

## 2018-05-16 DIAGNOSIS — S9031XA Contusion of right foot, initial encounter: Secondary | ICD-10-CM | POA: Diagnosis not present

## 2018-05-16 NOTE — Progress Notes (Signed)
He presents today states that a few weeks ago he noticed pain to the dorsal lateral aspect of his right foot which is his surgical foot from an endoscopic plantar fasciotomy.  At this point he states that seems to be doing much better and he denies any trauma other than a screen door hitting the foot.  Objective: Vital signs are stable he is alert and oriented x3.  There is some mild ecchymosis and postinflammatory hyperpigmentation overlying the fifth metatarsal cuboid articulation.  Radiographs taken today demonstrate what appears to be a small osteochondral defect to the cuboid at the fifth metatarsal base.  More than likely a crush injury.  Assessment: Resolving OCD cuboid.  Plan: Started him on Celebrex 200 mg 1 p.o. daily he will notify me with questions or concerns otherwise I will follow-up with him in 2 weeks if necessary.

## 2018-05-17 MED ORDER — CELECOXIB 200 MG PO CAPS: 200.0000 mg | ORAL_CAPSULE | Freq: Every day | ORAL | 2 refills | Status: AC

## 2018-05-17 NOTE — Addendum Note (Signed)
Addended by: Graceann Congress D on: 05/17/2018 02:32 PM   Modules accepted: Orders

## 2018-06-06 ENCOUNTER — Encounter: Payer: Self-pay | Admitting: Podiatry

## 2018-06-06 ENCOUNTER — Ambulatory Visit: Payer: Commercial Managed Care - PPO | Admitting: Podiatry

## 2018-06-06 DIAGNOSIS — G5791 Unspecified mononeuropathy of right lower limb: Secondary | ICD-10-CM | POA: Diagnosis not present

## 2018-06-06 NOTE — Progress Notes (Signed)
He presents today states that his heel is doing great but he has some pain around the forefoot that radiates into his toes.  Objective: He has pain on palpation third interdigital space with a palpable Mulder's click right foot.  Assessment: Neuroma third interspace right.  Plan: I injected his first dose of steroid 20 mg Kenalog 5 mg Marcaine point maximal tenderness.  Follow-up with him in 1 month if necessary.

## 2018-07-11 ENCOUNTER — Ambulatory Visit: Payer: Commercial Managed Care - PPO | Admitting: Podiatry

## 2019-06-03 ENCOUNTER — Other Ambulatory Visit: Payer: Self-pay

## 2019-06-03 DIAGNOSIS — Z20822 Contact with and (suspected) exposure to covid-19: Secondary | ICD-10-CM

## 2019-06-04 LAB — NOVEL CORONAVIRUS, NAA: SARS-CoV-2, NAA: NOT DETECTED

## 2021-02-03 ENCOUNTER — Ambulatory Visit: Payer: Commercial Managed Care - PPO | Admitting: Dermatology

## 2021-02-03 ENCOUNTER — Other Ambulatory Visit: Payer: Self-pay

## 2021-02-03 DIAGNOSIS — L821 Other seborrheic keratosis: Secondary | ICD-10-CM

## 2021-02-03 DIAGNOSIS — L814 Other melanin hyperpigmentation: Secondary | ICD-10-CM

## 2021-02-03 DIAGNOSIS — L578 Other skin changes due to chronic exposure to nonionizing radiation: Secondary | ICD-10-CM

## 2021-02-03 DIAGNOSIS — D225 Melanocytic nevi of trunk: Secondary | ICD-10-CM

## 2021-02-03 DIAGNOSIS — D229 Melanocytic nevi, unspecified: Secondary | ICD-10-CM

## 2021-02-03 DIAGNOSIS — Z1283 Encounter for screening for malignant neoplasm of skin: Secondary | ICD-10-CM | POA: Diagnosis not present

## 2021-02-03 DIAGNOSIS — D492 Neoplasm of unspecified behavior of bone, soft tissue, and skin: Secondary | ICD-10-CM

## 2021-02-03 DIAGNOSIS — D239 Other benign neoplasm of skin, unspecified: Secondary | ICD-10-CM

## 2021-02-03 DIAGNOSIS — D1800 Hemangioma unspecified site: Secondary | ICD-10-CM

## 2021-02-03 HISTORY — DX: Other benign neoplasm of skin, unspecified: D23.9

## 2021-02-03 NOTE — Progress Notes (Signed)
New Patient Visit  Subjective  Austin Crawford is a 55 y.o. male who presents for the following: Total body skin exam (No hx of skin ca) and check spot (R arm, no symptoms). The patient presents for Total-Body Skin Exam (TBSE) for skin cancer screening and mole check.  The following portions of the chart were reviewed this encounter and updated as appropriate:   Tobacco  Allergies  Meds  Problems  Med Hx  Surg Hx  Fam Hx     Review of Systems:  No other skin or systemic complaints except as noted in HPI or Assessment and Plan.  Objective  Well appearing patient in no apparent distress; mood and affect are within normal limits.  A full examination was performed including scalp, head, eyes, ears, nose, lips, neck, chest, axillae, abdomen, back, buttocks, bilateral upper extremities, bilateral lower extremities, hands, feet, fingers, toes, fingernails, and toenails. All findings within normal limits unless otherwise noted below.  Objective  R tricep: Stuck-on, waxy, tan-brown papules and plaques -- Discussed benign etiology and prognosis.   Objective  L of midline epigastric: Irregular brown macule 0.6cm  Objective  Right Upper Back 2.0cm lat to spine: 0.6cm irregular brown macule  Objective  Left mid to low back paraspinal: 0.9cm irregular brown macule  Objective  Right umbilicus: 2.8UX irregular brown macule  Right Upper Back 3.0cm lat to spine  Objective  Right Upper Back 3.0cm lat to spine: 0.6cm slightly irregular brown macule   Assessment & Plan    Lentigines - Scattered tan macules - Due to sun exposure - Benign-appering, observe - Recommend daily broad spectrum sunscreen SPF 30+ to sun-exposed areas, reapply every 2 hours as needed. - Call for any changes  Seborrheic Keratoses - Stuck-on, waxy, tan-brown papules and/or plaques  - Benign-appearing - Discussed benign etiology and prognosis. - Observe - Call for any changes  Melanocytic Nevi -  Tan-brown and/or pink-flesh-colored symmetric macules and papules - Benign appearing on exam today - Observation - Call clinic for new or changing moles - Recommend daily use of broad spectrum spf 30+ sunscreen to sun-exposed areas.   Hemangiomas - Red papules - Discussed benign nature - Observe - Call for any changes  Actinic Damage - Chronic condition, secondary to cumulative UV/sun exposure - diffuse scaly erythematous macules with underlying dyspigmentation - Recommend daily broad spectrum sunscreen SPF 30+ to sun-exposed areas, reapply every 2 hours as needed.  - Staying in the shade or wearing long sleeves, sun glasses (UVA+UVB protection) and wide brim hats (4-inch brim around the entire circumference of the hat) are also recommended for sun protection.  - Call for new or changing lesions.  Skin cancer screening performed today.  Seborrheic keratosis R tricep Benign, observe  Neoplasm of skin (4) L of midline epigastric Epidermal / dermal shaving  Lesion diameter (cm):  0.6 Informed consent: discussed and consent obtained   Timeout: patient name, date of birth, surgical site, and procedure verified   Procedure prep:  Patient was prepped and draped in usual sterile fashion Prep type:  Isopropyl alcohol Anesthesia: the lesion was anesthetized in a standard fashion   Anesthetic:  1% lidocaine w/ epinephrine 1-100,000 buffered w/ 8.4% NaHCO3 Instrument used: flexible razor blade   Hemostasis achieved with: pressure, aluminum chloride and electrodesiccation   Outcome: patient tolerated procedure well   Post-procedure details: sterile dressing applied and wound care instructions given   Dressing type: bandage and bacitracin    Specimen 1 - Surgical pathology Differential Diagnosis:  D48.5 Nevus vs Dysplastic Nevus  Check Margins: yes Irregular brown macule  Right Upper Back 2.0cm lat to spine Epidermal / dermal shaving  Lesion diameter (cm):  0.6 Informed consent:  discussed and consent obtained   Timeout: patient name, date of birth, surgical site, and procedure verified   Procedure prep:  Patient was prepped and draped in usual sterile fashion Prep type:  Isopropyl alcohol Anesthesia: the lesion was anesthetized in a standard fashion   Anesthetic:  1% lidocaine w/ epinephrine 1-100,000 buffered w/ 8.4% NaHCO3 Instrument used: flexible razor blade   Hemostasis achieved with: pressure, aluminum chloride and electrodesiccation   Outcome: patient tolerated procedure well   Post-procedure details: sterile dressing applied and wound care instructions given   Dressing type: bandage and petrolatum    Specimen 2 - Surgical pathology Differential Diagnosis: D48.5 Nevus vs Dysplastic Nevus  Check Margins: yes 0.6cm irregular brown macule  Left mid to low back paraspinal Epidermal / dermal shaving  Lesion diameter (cm):  0.9 Informed consent: discussed and consent obtained   Timeout: patient name, date of birth, surgical site, and procedure verified   Procedure prep:  Patient was prepped and draped in usual sterile fashion Prep type:  Isopropyl alcohol Anesthesia: the lesion was anesthetized in a standard fashion   Anesthetic:  1% lidocaine w/ epinephrine 1-100,000 buffered w/ 8.4% NaHCO3 Instrument used: flexible razor blade   Hemostasis achieved with: pressure, aluminum chloride and electrodesiccation   Outcome: patient tolerated procedure well   Post-procedure details: sterile dressing applied and wound care instructions given   Dressing type: bandage and petrolatum    Specimen 4 - Surgical pathology Differential Diagnosis: D48.5 Nevus vs Dysplastic Nevus  Check Margins: yes 0.9cm irregular brown macule  Right umbilicus Epidermal / dermal shaving  Lesion diameter (cm):  0.6 Informed consent: discussed and consent obtained   Timeout: patient name, date of birth, surgical site, and procedure verified   Procedure prep:  Patient was prepped  and draped in usual sterile fashion Prep type:  Isopropyl alcohol Anesthesia: the lesion was anesthetized in a standard fashion   Anesthetic:  1% lidocaine w/ epinephrine 1-100,000 buffered w/ 8.4% NaHCO3 Instrument used: flexible razor blade   Hemostasis achieved with: pressure, aluminum chloride and electrodesiccation   Outcome: patient tolerated procedure well   Post-procedure details: sterile dressing applied and wound care instructions given   Dressing type: bandage and petrolatum    Specimen 5 - Surgical pathology Differential Diagnosis: D48.5 Nevus vs Dysplastic Nevus  Check Margins: yes 0.6cm irregular brown macule  Nevus Right Upper Back 3.0cm lat to spine Slightly Irregular, pending other bx results may consider bx Observe for changes  Return in about 1 year (around 02/03/2022) for TBSE.   I, Othelia Pulling, RMA, am acting as scribe for Sarina Ser, MD .  Documentation: I have reviewed the above documentation for accuracy and completeness, and I agree with the above.  Sarina Ser, MD

## 2021-02-03 NOTE — Patient Instructions (Addendum)
If you have any questions or concerns for your doctor, please call our main line at [phone] and press option 4 to reach your doctor's medical assistant. If no one answers, please leave a voicemail as directed and we will return your call as soon as possible. Messages left after 4 pm will be answered the following business day.   You may also send us a message via MyChart. We typically respond to MyChart messages within 1-2 business days.  For prescription refills, please ask your pharmacy to contact our office. Our fax number is [phone].  If you have an urgent issue when the clinic is closed that cannot wait until the next business day, you can page your doctor at the number below.    Please note that while we do our best to be available for urgent issues outside of office hours, we are not available 24/7.   If you have an urgent issue and are unable to reach us, you may choose to seek medical care at your doctor's office, retail clinic, urgent care center, or emergency room.  If you have a medical emergency, please immediately call 911 or go to the emergency department.  Pager Numbers  - Dr. Kowalski: [phone]  - Dr. Moye: [phone]  - Dr. Stewart: [phone]  In the event of inclement weather, please call our main line at [phone] for an update on the status of any delays or closures.  Dermatology Medication Tips: Please keep the boxes that topical medications come in in order to help keep track of the instructions about where and how to use these. Pharmacies typically print the medication instructions only on the boxes and not directly on the medication tubes.   If your medication is too expensive, please contact our office at [phone] option 4 or send us a message through MyChart.   We are unable to tell what your co-pay for medications will be in advance as this is different depending on your insurance coverage. However, we may be able to find a  substitute medication at lower cost or fill out paperwork to get insurance to cover a needed medication.   If a prior authorization is required to get your medication covered by your insurance company, please allow us 1-2 business days to complete this process.  Drug prices often vary depending on where the prescription is filled and some pharmacies may offer cheaper prices.  The website www.goodrx.com contains coupons for medications through different pharmacies. The prices here do not account for what the cost may be with help from insurance (it may be cheaper with your insurance), but the website can give you the price if you did not use any insurance.  - You can print the associated coupon and take it with your prescription to the pharmacy.  - You may also stop by our office during regular business hours and pick up a GoodRx coupon card.  - If you need your prescription sent electronically to a different pharmacy, notify our office through [facility] MyChart or by phone at [phone] option 4.     Wound Care Instructions  1. Cleanse wound gently with soap and water once a day then pat dry with clean gauze. Apply a thing coat of Petrolatum (petroleum jelly, "Vaseline") over the wound (unless you have an allergy to this). We recommend that you use a new, sterile tube of Vaseline. Do not pick or remove scabs. Do not remove the yellow or white "healing tissue" from the base of the wound.    2. Cover the wound with fresh, clean, nonstick gauze and secure with paper tape. You may use Band-Aids in place of gauze and tape if the would is small enough, but would recommend trimming much of the tape off as there is often too much. Sometimes Band-Aids can irritate the skin.  3. You should call the office for your biopsy report after 1 week if you have not already been contacted.  4. If you experience any problems, such as abnormal amounts of bleeding, swelling, significant bruising, significant pain,  or evidence of infection, please call the office immediately.  5. FOR ADULT SURGERY PATIENTS: If you need something for pain relief you may take 1 extra strength Tylenol (acetaminophen) AND 2 Ibuprofen (200mg each) together every 4 hours as needed for pain. (do not take these if you are allergic to them or if you have a reason you should not take them.) Typically, you may only need pain medication for 1 to 3 days.     

## 2021-02-04 ENCOUNTER — Encounter: Payer: Self-pay | Admitting: Dermatology

## 2021-02-04 ENCOUNTER — Other Ambulatory Visit: Payer: Self-pay | Admitting: Dermatology

## 2021-02-09 ENCOUNTER — Telehealth: Payer: Self-pay

## 2021-02-09 NOTE — Telephone Encounter (Signed)
Unable to leave a voicemail - mail box not set up yet.

## 2021-02-09 NOTE — Telephone Encounter (Signed)
-----   Message from Ralene Bathe, MD sent at 02/09/2021 10:48 AM EDT ----- Diagnosis 1. 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 Skin , right upper back 2.0 cm lateral to spine DYSPLASTIC NEVUS WITH MODERATE TO SEVERE ATYPIA, DEEP MARGIN INVOLVED, SEE DESCRIPTION 3. Skin , left mid to low back paraspinal DYSPLASTIC COMPOUND NEVUS WITH MODERATE ATYPIA, DEEP MARGIN INVOLVED 4. Skin , right umbilicus DYSPLASTIC COMPOUND NEVUS WITH MODERATE ATYPIA, LATERAL AND DEEP MARGINS INVOLVED  1&2 - Moderate to Severe dysplastic Schedule for shave removal in 3 mos 3&4- Moderate dysplastic Recheck next visit  Please schedule pt for 3 mos visit

## 2021-05-24 ENCOUNTER — Ambulatory Visit: Payer: Commercial Managed Care - PPO | Admitting: Dermatology

## 2021-07-15 ENCOUNTER — Other Ambulatory Visit: Payer: Self-pay

## 2021-07-15 ENCOUNTER — Ambulatory Visit: Payer: Commercial Managed Care - PPO | Admitting: Dermatology

## 2021-07-15 ENCOUNTER — Encounter: Payer: Self-pay | Admitting: Dermatology

## 2021-07-15 DIAGNOSIS — D229 Melanocytic nevi, unspecified: Secondary | ICD-10-CM

## 2021-07-15 DIAGNOSIS — D225 Melanocytic nevi of trunk: Secondary | ICD-10-CM

## 2021-07-15 DIAGNOSIS — Z86018 Personal history of other benign neoplasm: Secondary | ICD-10-CM

## 2021-07-15 DIAGNOSIS — D239 Other benign neoplasm of skin, unspecified: Secondary | ICD-10-CM

## 2021-07-15 DIAGNOSIS — D492 Neoplasm of unspecified behavior of bone, soft tissue, and skin: Secondary | ICD-10-CM

## 2021-07-15 MED ORDER — MUPIROCIN 2 % EX OINT: 1.0000 "application " | TOPICAL_OINTMENT | Freq: Every day | CUTANEOUS | 2 refills | Status: AC

## 2021-07-15 NOTE — Patient Instructions (Signed)
If you have any questions or concerns for your doctor, please call our main line at [phone] and press option 4 to reach your doctor's medical assistant. If no one answers, please leave a voicemail as directed and we will return your call as soon as possible. Messages left after 4 pm will be answered the following business day.   You may also send us a message via MyChart. We typically respond to MyChart messages within 1-2 business days.  For prescription refills, please ask your pharmacy to contact our office. Our fax number is [phone].  If you have an urgent issue when the clinic is closed that cannot wait until the next business day, you can page your doctor at the number below.    Please note that while we do our best to be available for urgent issues outside of office hours, we are not available 24/7.   If you have an urgent issue and are unable to reach us, you may choose to seek medical care at your doctor's office, retail clinic, urgent care center, or emergency room.  If you have a medical emergency, please immediately call 911 or go to the emergency department.  Pager Numbers  - Dr. Kowalski: [phone]  - Dr. Moye: [phone]  - Dr. Stewart: [phone]  In the event of inclement weather, please call our main line at [phone] for an update on the status of any delays or closures.  Dermatology Medication Tips: Please keep the boxes that topical medications come in in order to help keep track of the instructions about where and how to use these. Pharmacies typically print the medication instructions only on the boxes and not directly on the medication tubes.   If your medication is too expensive, please contact our office at [phone] option 4 or send us a message through MyChart.   We are unable to tell what your co-pay for medications will be in advance as this is different depending on your insurance coverage. However, we may be able to find a substitute  medication at lower cost or fill out paperwork to get insurance to cover a needed medication.   If a prior authorization is required to get your medication covered by your insurance company, please allow us 1-2 business days to complete this process.  Drug prices often vary depending on where the prescription is filled and some pharmacies may offer cheaper prices.  The website www.goodrx.com contains coupons for medications through different pharmacies. The prices here do not account for what the cost may be with help from insurance (it may be cheaper with your insurance), but the website can give you the price if you did not use any insurance.  - You can print the associated coupon and take it with your prescription to the pharmacy.  - You may also stop by our office during regular business hours and pick up a GoodRx coupon card.  - If you need your prescription sent electronically to a different pharmacy, notify our office through [facility] MyChart or by phone at [phone] option 4.   Wound Care Instructions  Cleanse wound gently with soap and water once a day then pat dry with clean gauze. Apply a thing coat of Petrolatum (petroleum jelly, "Vaseline") over the wound (unless you have an allergy to this). We recommend that you use a new, sterile tube of Vaseline. Do not pick or remove scabs. Do not remove the yellow or white "healing tissue" from the base of the wound.  Cover the   wound with fresh, clean, nonstick gauze and secure with paper tape. You may use Band-Aids in place of gauze and tape if the would is small enough, but would recommend trimming much of the tape off as there is often too much. Sometimes Band-Aids can irritate the skin.  You should call the office for your biopsy report after 1 week if you have not already been contacted.  If you experience any problems, such as abnormal amounts of bleeding, swelling, significant bruising, significant pain, or evidence of infection,  please call the office immediately.  FOR ADULT SURGERY PATIENTS: If you need something for pain relief you may take 1 extra strength Tylenol (acetaminophen) AND 2 Ibuprofen (200mg each) together every 4 hours as needed for pain. (do not take these if you are allergic to them or if you have a reason you should not take them.) Typically, you may only need pain medication for 1 to 3 days.    

## 2021-07-15 NOTE — Progress Notes (Signed)
Follow-Up Visit   Subjective  Austin Crawford is a 55 y.o. male who presents for the following: bx proven dysplastic nevi mod to severe (L midline epigastric, right upper back 2.0 cm lateral to spine). Hx of other dysplastic nevi to check today.  The following portions of the chart were reviewed this encounter and updated as appropriate:   Tobacco  Allergies  Meds  Problems  Med Hx  Surg Hx  Fam Hx     Review of Systems:  No other skin or systemic complaints except as noted in HPI or Assessment and Plan.  Objective  Well appearing patient in no apparent distress; mood and affect are within normal limits.  A focused examination was performed including trunk. Relevant physical exam findings are noted in the Assessment and Plan.  L midline of epigastric; Right Upper Back 2.0cm lat to spine  L midline of epigastric Pink bx site 1.2cm  Right Upper Back 2.0cm lat to spine Pink bx site with repigmentation 1.4cm  Scars with no evidence of recurrence.   L mid to low back paraspinal Pink bx site with repigmentaton 6.0VP  R umbilicus Pink bx site  Right Upper Back 3.0cm lat to spine 0.6cm irregular brown macule   Assessment & Plan  Dysplastic nevus (2) L midline of epigastric; Right Upper Back 2.0cm lat to spine  With moderate to severe atypia Bx proven 004/14/2022  Epidermal / dermal shaving - Right Upper Back 2.0cm lat to spine  Lesion diameter (cm):  1.4 Informed consent: discussed and consent obtained   Timeout: patient name, date of birth, surgical site, and procedure verified   Procedure prep:  Patient was prepped and draped in usual sterile fashion Prep type:  Isopropyl alcohol Anesthesia: the lesion was anesthetized in a standard fashion   Anesthetic:  1% lidocaine w/ epinephrine 1-100,000 buffered w/ 8.4% NaHCO3 Instrument used: flexible razor blade   Hemostasis achieved with: pressure, aluminum chloride and electrodesiccation   Outcome: patient  tolerated procedure well   Post-procedure details: sterile dressing applied and wound care instructions given   Dressing type: bandage and bacitracin    Epidermal / dermal shaving - L midline of epigastric  Lesion diameter (cm):  1.2 Informed consent: discussed and consent obtained   Timeout: patient name, date of birth, surgical site, and procedure verified   Procedure prep:  Patient was prepped and draped in usual sterile fashion Prep type:  Isopropyl alcohol Anesthesia: the lesion was anesthetized in a standard fashion   Anesthetic:  1% lidocaine w/ epinephrine 1-100,000 buffered w/ 8.4% NaHCO3 Instrument used: flexible razor blade   Hemostasis achieved with: pressure, aluminum chloride and electrodesiccation   Outcome: patient tolerated procedure well   Post-procedure details: sterile dressing applied and wound care instructions given   Dressing type: bandage and bacitracin    Specimen 2 - Surgical pathology Differential Diagnosis: D48.5 Bx proven Dysplastic Nevus  Check Margins: yes Pink bx site with repigmentation 1.4cm [id number]  Specimen 3 - Surgical pathology Differential Diagnosis: D48.5 Bx proven Dyspalstic nevus  Check Margins: yes Pink bx site 1.2cm [id number]  History of dysplastic nevus (2) R umbilicus; L mid to low back paraspinal R umbilicus clear,observe for changes/recurrence  L mid to low back paraspinal with repigmentation, shave removal today Epidermal / dermal shaving - L mid to low back paraspinal  Lesion diameter (cm):  0.6 Informed consent: discussed and consent obtained   Timeout: patient name, date of birth, surgical site, and procedure verified   Procedure prep:  Patient was prepped and draped in usual sterile fashion Prep type:  Isopropyl alcohol Anesthesia: the lesion was anesthetized in a standard fashion   Anesthetic:  1% lidocaine w/ epinephrine 1-100,000 buffered w/ 8.4% NaHCO3 Instrument used: flexible razor blade   Hemostasis  achieved with: pressure, aluminum chloride and electrodesiccation   Outcome: patient tolerated procedure well   Post-procedure details: sterile dressing applied and wound care instructions given   Dressing type: bandage and bacitracin    Related Medications mupirocin ointment (BACTROBAN) 2 % Apply 1 application topically daily. Qd to biopsy sites  Neoplasm of skin Right Upper Back 3.0cm lat to spine Epidermal / dermal shaving  Lesion diameter (cm):  0.6 Informed consent: discussed and consent obtained   Timeout: patient name, date of birth, surgical site, and procedure verified   Procedure prep:  Patient was prepped and draped in usual sterile fashion Prep type:  Isopropyl alcohol Anesthesia: the lesion was anesthetized in a standard fashion   Anesthetic:  1% lidocaine w/ epinephrine 1-100,000 buffered w/ 8.4% NaHCO3 Instrument used: flexible razor blade   Hemostasis achieved with: pressure, aluminum chloride and electrodesiccation   Outcome: patient tolerated procedure well   Post-procedure details: sterile dressing applied and wound care instructions given   Dressing type: bandage and petrolatum    Specimen 1 - Surgical pathology Differential Diagnosis: D48.5 Nevus vs Dysplastic Nevus  Check Margins: yes 0.6cm irregular brown macule  Melanocytic Nevi - Tan-brown and/or pink-flesh-colored symmetric macules and papules - Benign appearing on exam today - Observation - Call clinic for new or changing moles - Recommend daily use of broad spectrum spf 30+ sunscreen to sun-exposed areas.   Return for as scheduled.  I, Othelia Pulling, RMA, am acting as scribe for Sarina Ser, MD . Documentation: I have reviewed the above documentation for accuracy and completeness, and I agree with the above.  Sarina Ser, MD

## 2021-07-21 ENCOUNTER — Encounter: Payer: Self-pay | Admitting: Dermatology

## 2021-07-23 ENCOUNTER — Telehealth: Payer: Self-pay

## 2021-07-23 NOTE — Telephone Encounter (Signed)
-----   Message from Ralene Bathe, MD sent at 07/22/2021  5:18 PM EDT ----- Diagnosis 1. Skin , right upper back 3.0cm lat to spine DYSPLASTIC NEVUS WITH MODERATE TO SEVERE ATYPIA, CLOSE TO MARGIN, SEE DESCRIPTION 2. Skin , right upper back 2.0cm lat to spine RESIDUAL DYSPLASTIC NEVUS, MARGIN FREE 3. Skin , L midline of epigastric NO RESIDUAL DYSPLASTIC NEVUS, MARGINS FREE  1- Moderate to severe dysplastic Margins free, but "close to margin" Recheck next visit May need additional procedure 2&3 - both sites of Moderate to Severe dysplastic Margins free Recheck next visit  Keep 02/03/22 visit

## 2021-07-23 NOTE — Telephone Encounter (Signed)
Unable to leave a message since voicemail box not set up.

## 2021-08-02 ENCOUNTER — Telehealth: Payer: Self-pay

## 2021-08-02 NOTE — Telephone Encounter (Signed)
Patient informed of pathology results 

## 2021-08-02 NOTE — Telephone Encounter (Signed)
-----   Message from Ralene Bathe, MD sent at 07/22/2021  5:18 PM EDT ----- Diagnosis 1. Skin , right upper back 3.0cm lat to spine DYSPLASTIC NEVUS WITH MODERATE TO SEVERE ATYPIA, CLOSE TO MARGIN, SEE DESCRIPTION 2. Skin , right upper back 2.0cm lat to spine RESIDUAL DYSPLASTIC NEVUS, MARGIN FREE 3. Skin , L midline of epigastric NO RESIDUAL DYSPLASTIC NEVUS, MARGINS FREE  1- Moderate to severe dysplastic Margins free, but "close to margin" Recheck next visit May need additional procedure 2&3 - both sites of Moderate to Severe dysplastic Margins free Recheck next visit  Keep 02/03/22 visit

## 2022-02-03 ENCOUNTER — Ambulatory Visit: Payer: Commercial Managed Care - PPO | Admitting: Dermatology

## 2022-02-15 ENCOUNTER — Other Ambulatory Visit: Payer: Self-pay

## 2022-07-20 ENCOUNTER — Encounter: Payer: Self-pay | Admitting: Dermatology

## 2022-07-20 ENCOUNTER — Ambulatory Visit: Payer: Commercial Managed Care - PPO | Admitting: Dermatology

## 2022-07-20 DIAGNOSIS — L821 Other seborrheic keratosis: Secondary | ICD-10-CM | POA: Diagnosis not present

## 2022-07-20 DIAGNOSIS — Z86018 Personal history of other benign neoplasm: Secondary | ICD-10-CM

## 2022-07-20 DIAGNOSIS — L578 Other skin changes due to chronic exposure to nonionizing radiation: Secondary | ICD-10-CM

## 2022-07-20 DIAGNOSIS — D1801 Hemangioma of skin and subcutaneous tissue: Secondary | ICD-10-CM

## 2022-07-20 DIAGNOSIS — Z1283 Encounter for screening for malignant neoplasm of skin: Secondary | ICD-10-CM | POA: Diagnosis not present

## 2022-07-20 DIAGNOSIS — L814 Other melanin hyperpigmentation: Secondary | ICD-10-CM

## 2022-07-20 DIAGNOSIS — L918 Other hypertrophic disorders of the skin: Secondary | ICD-10-CM

## 2022-07-20 DIAGNOSIS — D229 Melanocytic nevi, unspecified: Secondary | ICD-10-CM

## 2022-07-20 DIAGNOSIS — D239 Other benign neoplasm of skin, unspecified: Secondary | ICD-10-CM

## 2022-07-20 NOTE — Progress Notes (Unsigned)
   Follow-Up Visit   Subjective  Austin Crawford is a 56 y.o. male who presents for the following: Annual Exam (Hx of several dysplastic nevi. Two have had severe atypia). The patient presents for Total-Body Skin Exam (TBSE) for skin cancer screening and mole check.  The patient has spots, moles and lesions to be evaluated, some may be new or changing and the patient has concerns that these could be cancer.  The following portions of the chart were reviewed this encounter and updated as appropriate:  Tobacco  Allergies  Meds  Problems  Med Hx  Surg Hx  Fam Hx     Review of Systems: No other skin or systemic complaints except as noted in HPI or Assessment and Plan.  Objective  Well appearing patient in no apparent distress; mood and affect are within normal limits.  A full examination was performed including scalp, head, eyes, ears, nose, lips, neck, chest, axillae, abdomen, back, buttocks, bilateral upper extremities, bilateral lower extremities, hands, feet, fingers, toes, fingernails, and toenails. All findings within normal limits unless otherwise noted below.  Right Temple Stuck-on, waxy, tan-brown papule or plaque --Discussed benign etiology and prognosis.    Assessment & Plan   History of Dysplastic Nevi - No evidence of recurrence today - Recommend regular full body skin exams - Recommend daily broad spectrum sunscreen SPF 30+ to sun-exposed areas, reapply every 2 hours as needed.  - Call if any new or changing lesions are noted between office visits   Lentigines - Scattered tan macules - Due to sun exposure - Benign-appearing, observe - Recommend daily broad spectrum sunscreen SPF 30+ to sun-exposed areas, reapply every 2 hours as needed. - Call for any changes  Seborrheic Keratoses - Stuck-on, waxy, tan-brown papules and/or plaques  - Benign-appearing - Discussed benign etiology and prognosis. - Observe - Call for any changes  Melanocytic Nevi - Tan-brown  and/or pink-flesh-colored symmetric macules and papules - Benign appearing on exam today - Observation - Call clinic for new or changing moles - Recommend daily use of broad spectrum spf 30+ sunscreen to sun-exposed areas.   Hemangiomas - Red papules - Discussed benign nature - Observe - Call for any changes  Actinic Damage - Chronic condition, secondary to cumulative UV/sun exposure - diffuse scaly erythematous macules with underlying dyspigmentation - Recommend daily broad spectrum sunscreen SPF 30+ to sun-exposed areas, reapply every 2 hours as needed.  - Staying in the shade or wearing long sleeves, sun glasses (UVA+UVB protection) and wide brim hats (4-inch brim around the entire circumference of the hat) are also recommended for sun protection.  - Call for new or changing lesions.  Skin cancer screening performed today.  Acrochordons (Skin Tags) - Fleshy, skin-colored pedunculated papules - Benign appearing.  - Observe. - If desired, they can be removed with an in office procedure that is not covered by insurance. - Please call the clinic if you notice any new or changing lesions.   Seborrheic keratosis Right Temple Reassured benign age-related growth.  Recommend observation.  Discussed cryotherapy if spot(s) become irritated or inflamed.  Return in about 1 year (around 07/21/2023) for TBSE, HxDN. Documentation: I have reviewed the above documentation for accuracy and completeness, and I agree with the above.  Sarina Ser, MD

## 2022-07-20 NOTE — Patient Instructions (Addendum)
Recommend daily broad spectrum sunscreen SPF 30+ to sun-exposed areas, reapply every 2 hours as needed. Call for new or changing lesions.  Staying in the shade or wearing long sleeves, sun glasses (UVA+UVB protection) and wide brim hats (4-inch brim around the entire circumference of the hat) are also recommended for sun protection.     Melanoma ABCDEs  Melanoma is the most dangerous type of skin cancer, and is the leading cause of death from skin disease.  You are more likely to develop melanoma if you: Have light-colored skin, light-colored eyes, or red or blond hair Spend a lot of time in the sun Tan regularly, either outdoors or in a tanning bed Have had blistering sunburns, especially during childhood Have a close family member who has had a melanoma Have atypical moles or large birthmarks  Early detection of melanoma is key since treatment is typically straightforward and cure rates are extremely high if we catch it early.   The first sign of melanoma is often a change in a mole or a new dark spot.  The ABCDE system is a way of remembering the signs of melanoma.  A for asymmetry:  The two halves do not match. B for border:  The edges of the growth are irregular. C for color:  A mixture of colors are present instead of an even brown color. D for diameter:  Melanomas are usually (but not always) greater than 42m - the size of a pencil eraser. E for evolution:  The spot keeps changing in size, shape, and color.  Please check your skin once per month between visits. You can use a small mirror in front and a large mirror behind you to keep an eye on the back side or your body.   If you see any new or changing lesions before your next follow-up, please call to schedule a visit.  Please continue daily skin protection including broad spectrum sunscreen SPF 30+ to sun-exposed areas, reapplying every 2 hours as needed when you're outdoors.   Staying in the shade or wearing long sleeves,  sun glasses (UVA+UVB protection) and wide brim hats (4-inch brim around the entire circumference of the hat) are also recommended for sun protection.     Seborrheic Keratosis  What causes seborrheic keratoses? Seborrheic keratoses are harmless, common skin growths that first appear during adult life.  As time goes by, more growths appear.  Some people may develop a large number of them.  Seborrheic keratoses appear on both covered and uncovered body parts.  They are not caused by sunlight.  The tendency to develop seborrheic keratoses can be inherited.  They vary in color from skin-colored to gray, brown, or even black.  They can be either smooth or have a rough, warty surface.   Seborrheic keratoses are superficial and look as if they were stuck on the skin.  Under the microscope this type of keratosis looks like layers upon layers of skin.  That is why at times the top layer may seem to fall off, but the rest of the growth remains and re-grows.    Treatment Seborrheic keratoses do not need to be treated, but can easily be removed in the office.  Seborrheic keratoses often cause symptoms when they rub on clothing or jewelry.  Lesions can be in the way of shaving.  If they become inflamed, they can cause itching, soreness, or burning.  Removal of a seborrheic keratosis can be accomplished by freezing, burning, or surgery. If any spot bleeds,  scabs, or grows rapidly, please return to have it checked, as these can be an indication of a skin cancer.   Due to recent changes in healthcare laws, you may see results of your pathology and/or laboratory studies on MyChart before the doctors have had a chance to review them. We understand that in some cases there may be results that are confusing or concerning to you. Please understand that not all results are received at the same time and often the doctors may need to interpret multiple results in order to provide you with the best plan of care or course of  treatment. Therefore, we ask that you please give [place] 2 business days to thoroughly review all your results before contacting the office for clarification. Should we see a critical lab result, you will be contacted sooner.   If You Need Anything After Your Visit  If you have any questions or concerns for your doctor, please call our main line at [phone] and press option 4 to reach your doctor's medical assistant. If no one answers, please leave a voicemail as directed and we will return your call as soon as possible. Messages left after 4 pm will be answered the following business day.   You may also send [place] a message via [place]. We typically respond to MyChart messages within 1-2 business days.  For prescription refills, please ask your pharmacy to contact our office. Our fax number is [phone].  If you have an urgent issue when the clinic is closed that cannot wait until the next business day, you can page your doctor at the number below.    Please note that while we do our best to be available for urgent issues outside of office hours, we are not available 24/7.   If you have an urgent issue and are unable to reach [place], you may choose to seek medical care at your doctor's office, retail clinic, urgent care center, or emergency room.  If you have a medical emergency, please immediately call 911 or go to the emergency department.  Pager Numbers  - Dr. Nehemiah Massed: [phone]  - Dr. Laurence Ferrari: [phone]  - Dr. Nicole Kindred: [phone]  In the event of inclement weather, please call our main line at [phone] for an update on the status of any delays or closures.  Dermatology Medication Tips: Please keep the boxes that topical medications come in in order to help keep track of the instructions about where and how to use these. Pharmacies typically print the medication instructions only on the boxes and not directly on the medication tubes.   If your medication is too expensive,  please contact our office at [phone] option 4 or send [place] a message through [place].   We are unable to tell what your co-pay for medications will be in advance as this is different depending on your insurance coverage. However, we may be able to find a substitute medication at lower cost or fill out paperwork to get insurance to cover a needed medication.   If a prior authorization is required to get your medication covered by your insurance company, please allow [place] 1-2 business days to complete this process.  Drug prices often vary depending on where the prescription is filled and some pharmacies may offer cheaper prices.  The website www.goodrx.com contains coupons for medications through different pharmacies. The prices here do not account for what the cost may be with help from insurance (it may be cheaper with your insurance), but the website can  give you the price if you did not use any insurance.  - You can print the associated coupon and take it with your prescription to the pharmacy.  - You may also stop by our office during regular business hours and pick up a GoodRx coupon card.  - If you need your prescription sent electronically to a different pharmacy, notify our office through [facility] or by phone at [phone] option 4.     Si Usted Necesita Algo Despus de Su Visita  Tambin puede enviarnos un mensaje a travs de Pharmacist, community. Por lo general respondemos a los mensajes de MyChart en el transcurso de 1 a 2 das hbiles.  Para renovar recetas, por favor pida a su farmacia que se ponga en contacto con nuestra oficina. Harland Dingwall de fax es [place] [phone].  Si tiene un asunto urgente cuando la clnica est cerrada y que no puede esperar hasta el siguiente da hbil, puede llamar/localizar a su doctor(a) al nmero que aparece a continuacin.   Por favor, tenga en cuenta que aunque hacemos todo lo posible para estar disponibles para asuntos urgentes fuera del  horario de [place], no estamos disponibles las 24 horas del da, los 7 das de la [place].   Si tiene un problema urgente y no puede comunicarse con nosotros, puede optar por buscar atencin mdica  en el consultorio de su doctor(a), en una clnica privada, en un centro de atencin urgente o en una sala de emergencias.  Si tiene Engineering geologist, por favor llame inmediatamente al 911 o vaya a la sala de emergencias.  Nmeros de bper  - Dr. Nehemiah Massed: [phone]  - Dra. Moye: [phone]  - Dra. Nicole Kindred: [phone]  En caso de inclemencias del [place], por favor llame a Johnsie Kindred principal al [phone] para una actualizacin sobre el [place] de cualquier retraso o cierre.  Consejos para la medicacin en dermatologa: Por favor, guarde las cajas en las que vienen los medicamentos de uso tpico para ayudarle a seguir las instrucciones sobre dnde y cmo usarlos. Las farmacias generalmente imprimen las instrucciones del medicamento slo en las cajas y no directamente en los tubos del [place].   Si su medicamento es muy caro, por favor, pngase en contacto con Zigmund Daniel llamando al [phone] y presione la opcin 4 o envenos un mensaje a travs de Pharmacist, community.   No podemos decirle cul ser su copago por los medicamentos por adelantado ya que esto es diferente dependiendo de la cobertura de su seguro. Sin embargo, es posible que podamos encontrar un medicamento sustituto a Electrical engineer un formulario para que el seguro cubra el medicamento que se considera necesario.   Si se requiere una autorizacin previa para que su compaa de seguros [place] su medicamento, por favor permtanos de 1 a 2 das hbiles para completar este proceso.  Los precios de los medicamentos varan con frecuencia dependiendo del Environmental consultant de dnde se surte la receta y alguna farmacias pueden ofrecer precios ms baratos.  El sitio web www.goodrx.com tiene cupones para medicamentos de Office manager. Los precios aqu no tienen en cuenta lo que podra costar con la ayuda del seguro (puede ser ms barato con su seguro), pero el sitio web puede darle el precio si no utiliz Research scientist (physical sciences).  - Puede imprimir el cupn correspondiente y llevarlo con su receta a la farmacia.  - Tambin puede pasar por nuestra oficina durante el horario de atencin regular y Charity fundraiser una tarjeta de cupones de GoodRx.  -  Si necesita que su receta se enve electrnicamente a una farmacia diferente, informe a nuestra oficina a travs de MyChart de [place] o por telfono llamando al [phone] y presione la opcin 4.  

## 2022-07-21 ENCOUNTER — Encounter: Payer: Self-pay | Admitting: Dermatology

## 2022-11-25 ENCOUNTER — Other Ambulatory Visit: Payer: Commercial Managed Care - PPO

## 2022-11-25 DIAGNOSIS — K5730 Diverticulosis of large intestine without perforation or abscess without bleeding: Secondary | ICD-10-CM | POA: Diagnosis not present

## 2022-11-25 DIAGNOSIS — Z1211 Encounter for screening for malignant neoplasm of colon: Secondary | ICD-10-CM | POA: Diagnosis present

## 2022-11-25 DIAGNOSIS — K640 First degree hemorrhoids: Secondary | ICD-10-CM | POA: Diagnosis not present

## 2023-07-26 ENCOUNTER — Ambulatory Visit: Payer: Commercial Managed Care - PPO | Admitting: Dermatology

## 2023-07-26 ENCOUNTER — Encounter: Payer: Self-pay | Admitting: Dermatology

## 2023-07-26 DIAGNOSIS — L578 Other skin changes due to chronic exposure to nonionizing radiation: Secondary | ICD-10-CM | POA: Diagnosis not present

## 2023-07-26 DIAGNOSIS — L821 Other seborrheic keratosis: Secondary | ICD-10-CM | POA: Diagnosis not present

## 2023-07-26 DIAGNOSIS — D1801 Hemangioma of skin and subcutaneous tissue: Secondary | ICD-10-CM

## 2023-07-26 DIAGNOSIS — Z1283 Encounter for screening for malignant neoplasm of skin: Secondary | ICD-10-CM

## 2023-07-26 DIAGNOSIS — W908XXA Exposure to other nonionizing radiation, initial encounter: Secondary | ICD-10-CM

## 2023-07-26 DIAGNOSIS — Z86018 Personal history of other benign neoplasm: Secondary | ICD-10-CM

## 2023-07-26 DIAGNOSIS — L814 Other melanin hyperpigmentation: Secondary | ICD-10-CM

## 2023-07-26 DIAGNOSIS — D229 Melanocytic nevi, unspecified: Secondary | ICD-10-CM

## 2023-07-26 NOTE — Progress Notes (Unsigned)
   Follow-Up Visit   Subjective  Austin Crawford is a 57 y.o. male who presents for the following: Skin Cancer Screening and Full Body Skin Exam. Hx of multiple dysplastic nevi. No family Hx of MM.   The patient presents for Total-Body Skin Exam (TBSE) for skin cancer screening and mole check. The patient has spots, moles and lesions to be evaluated, some may be new or changing and the patient may have concern these could be cancer.    The following portions of the chart were reviewed this encounter and updated as appropriate: medications, allergies, medical history  Review of Systems:  No other skin or systemic complaints except as noted in HPI or Assessment and Plan.  Objective  Well appearing patient in no apparent distress; mood and affect are within normal limits.  A full examination was performed including scalp, head, eyes, ears, nose, lips, neck, chest, axillae, abdomen, back, buttocks, bilateral upper extremities, bilateral lower extremities, hands, feet, fingers, toes, fingernails, and toenails. All findings within normal limits unless otherwise noted below.   Relevant physical exam findings are noted in the Assessment and Plan.       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 No evidence of recurrence today Recommend regular full body skin exams Recommend daily broad spectrum sunscreen SPF 30+ to sun-exposed areas, reapply every 2 hours as needed.  Call if any new or changing lesions are noted between office visits   SKIN CANCER SCREENING PERFORMED TODAY.  ACTINIC DAMAGE - Chronic condition, secondary to cumulative UV/sun exposure - diffuse scaly erythematous macules with underlying dyspigmentation - Recommend daily broad spectrum sunscreen SPF 30+ to sun-exposed areas, reapply every 2 hours as needed.  - Staying in the shade or wearing long sleeves, sun glasses (UVA+UVB protection) and wide brim hats (4-inch  brim around the entire circumference of the hat) are also recommended for sun protection.  - Call for new or changing lesions.  LENTIGINES, SEBORRHEIC KERATOSES, HEMANGIOMAS - Benign normal skin lesions - Benign-appearing - Call for any changes  MELANOCYTIC NEVI - Tan-brown and/or pink-flesh-colored symmetric macules and papules - Benign appearing on exam today - Observation - Call clinic for new or changing moles - Recommend daily use of broad spectrum spf 30+ sunscreen to sun-exposed areas.    SEBORRHEIC KERATOSIS - Stuck-on, waxy, tan-brown papule at right temple - Benign-appearing - Discussed benign etiology and prognosis. - Observe - Call for any changes Left upper back paraspinal 1.5 cm without features suspicious for malignancy on dermoscopy. Dermatoscopic features consistent with SK.   R temple 1 cm brown waxy papule   Return in about 1 year (around 07/25/2024) for TBSE, HxDN.  I, Lawson Radar, CMA, am acting as scribe for Armida Sans, MD.   Documentation: I have reviewed the above documentation for accuracy and completeness, and I agree with the above.  Armida Sans, MD

## 2023-07-26 NOTE — Patient Instructions (Signed)
 Recommend daily broad spectrum sunscreen SPF 30+ to sun-exposed areas, reapply every 2 hours as needed. Call for new or changing lesions.  Staying in the shade or wearing long sleeves, sun glasses (UVA+UVB protection) and wide brim hats (4-inch brim around the entire circumference of the hat) are also recommended for sun protection.    Melanoma ABCDEs  Melanoma is the most dangerous type of skin cancer, and is the leading cause of death from skin disease.  You are more likely to develop melanoma if you: Have light-colored skin, light-colored eyes, or red or blond hair Spend a lot of time in the sun Tan regularly, either outdoors or in a tanning bed Have had blistering sunburns, especially during childhood Have a close family member who has had a melanoma Have atypical moles or large birthmarks  Early detection of melanoma is key since treatment is typically straightforward and cure rates are extremely high if we catch it early.   The first sign of melanoma is often a change in a mole or a new dark spot.  The ABCDE system is a way of remembering the signs of melanoma.  A for asymmetry:  The two halves do not match. B for border:  The edges of the growth are irregular. C for color:  A mixture of colors are present instead of an even brown color. D for diameter:  Melanomas are usually (but not always) greater than 6mm - the size of a pencil eraser. E for evolution:  The spot keeps changing in size, shape, and color.  Please check your skin once per month between visits. You can use a small mirror in front and a large mirror behind you to keep an eye on the back side or your body.   If you see any new or changing lesions before your next follow-up, please call to schedule a visit.  Please continue daily skin protection including broad spectrum sunscreen SPF 30+ to sun-exposed areas, reapplying every 2 hours as needed when you're outdoors.   Staying in the shade or wearing long sleeves, sun  glasses (UVA+UVB protection) and wide brim hats (4-inch brim around the entire circumference of the hat) are also recommended for sun protection.    Due to recent changes in healthcare laws, you may see results of your pathology and/or laboratory studies on MyChart before the doctors have had a chance to review them. We understand that in some cases there may be results that are confusing or concerning to you. Please understand that not all results are received at the same time and often the doctors may need to interpret multiple results in order to provide you with the best plan of care or course of treatment. Therefore, we ask that you please give [place] 2 business days to thoroughly review all your results before contacting the office for clarification. Should we see a critical lab result, you will be contacted sooner.   If You Need Anything After Your Visit  If you have any questions or concerns for your doctor, please call our main line at [phone] and press option 4 to reach your doctor's medical assistant. If no one answers, please leave a voicemail as directed and we will return your call as soon as possible. Messages left after 4 pm will be answered the following business day.   You may also send [place] a message via MyChart. We typically respond to MyChart messages within 1-2 business days.  For prescription refills, please ask your pharmacy to contact our  office. Our fax number is [phone].  If you have an urgent issue when the clinic is closed that cannot wait until the next business day, you can page your doctor at the number below.    Please note that while we do our best to be available for urgent issues outside of office hours, we are not available 24/7.   If you have an urgent issue and are unable to reach [place], you may choose to seek medical care at your doctor's office, retail clinic, urgent care center, or emergency room.  If you have a medical emergency, please immediately call  911 or go to the emergency department.  Pager Numbers  - Dr. Gwen Pounds: [phone]  - Dr. Roseanne Reno: [phone]  - Dr. Katrinka Blazing: [phone]   In the event of inclement weather, please call our main line at [phone] for an update on the status of any delays or closures.  Dermatology Medication Tips: Please keep the boxes that topical medications come in in order to help keep track of the instructions about where and how to use these. Pharmacies typically print the medication instructions only on the boxes and not directly on the medication tubes.   If your medication is too expensive, please contact our office at [phone] option 4 or send [place] a message through MyChart.   We are unable to tell what your co-pay for medications will be in advance as this is different depending on your insurance coverage. However, we may be able to find a substitute medication at lower cost or fill out paperwork to get insurance to cover a needed medication.   If a prior authorization is required to get your medication covered by your insurance company, please allow [place] 1-2 business days to complete this process.  Drug prices often vary depending on where the prescription is filled and some pharmacies may offer cheaper prices.  The website www.goodrx.com contains coupons for medications through different pharmacies. The prices here do not account for what the cost may be with help from insurance (it may be cheaper with your insurance), but the website can give you the price if you did not use any insurance.  - You can print the associated coupon and take it with your prescription to the pharmacy.  - You may also stop by our office during regular business hours and pick up a GoodRx coupon card.  - If you need your prescription sent electronically to a different pharmacy, notify our office through [facility] or by phone at [phone] option 4.     Si Usted Necesita Algo Despus de Su  Visita  Tambin puede enviarnos un mensaje a travs de Clinical cytogeneticist. Por lo general respondemos a los mensajes de MyChart en el transcurso de 1 a 2 das hbiles.  Para renovar recetas, por favor pida a su farmacia que se ponga en contacto con nuestra oficina. Annie Sable de fax es [place] [phone].  Si tiene un asunto urgente cuando la clnica est cerrada y que no puede esperar hasta el siguiente da hbil, puede llamar/localizar a su doctor(a) al nmero que aparece a continuacin.   Por favor, tenga en cuenta que aunque hacemos todo lo posible para estar disponibles para asuntos urgentes fuera del horario de [place], no estamos disponibles las 24 horas del da, los 7 [address]  [address] de la [place].   Si tiene un problema urgente y no puede comunicarse con nosotros, puede optar por buscar atencin mdica  en el consultorio de su doctor(a), en una clnica privada,  en un centro de atencin urgente o en una sala de emergencias.  Si tiene Engineer, drilling, por favor llame inmediatamente al 911 o vaya a la sala de emergencias.  Nmeros de bper  - Dr. Gwen Pounds: [phone]  - Dra. Roseanne Reno: [phone]  - Dr. Katrinka Blazing: [phone]   En caso de inclemencias del tiempo, por favor llame a Lacy Duverney principal al [phone] para una actualizacin sobre el [place] de cualquier retraso o cierre.  Consejos para la medicacin en dermatologa: Por favor, guarde las cajas en las que vienen los medicamentos de uso tpico para ayudarle a seguir las instrucciones sobre dnde y cmo usarlos. Las farmacias generalmente imprimen las instrucciones del medicamento slo en las cajas y no directamente en los tubos del [place].   Si su medicamento es muy caro, por favor, pngase en contacto con Rolm Gala llamando al [phone] y presione la opcin 4 o envenos un mensaje a travs de Clinical cytogeneticist.   No podemos decirle cul ser su copago por los medicamentos por adelantado ya que esto es diferente dependiendo de  la cobertura de su seguro. Sin embargo, es posible que podamos encontrar un medicamento sustituto a Audiological scientist un formulario para que el seguro cubra el medicamento que se considera necesario.   Si se requiere una autorizacin previa para que su compaa de seguros [place] su medicamento, por favor permtanos de 1 a 2 das hbiles para completar [address]  Los precios de los medicamentos varan con frecuencia dependiendo del Environmental consultant de dnde se surte la receta y alguna farmacias pueden ofrecer precios ms baratos.  El sitio web www.goodrx.com tiene cupones para medicamentos de Health and safety inspector. Los precios aqu no tienen en cuenta lo que podra costar con la ayuda del seguro (puede ser ms barato con su seguro), pero el sitio web puede darle el precio si no utiliz Tourist information centre manager.  - Puede imprimir el cupn correspondiente y llevarlo con su receta a la farmacia.  - Tambin puede pasar por nuestra oficina durante el horario de atencin regular y Education officer, museum una tarjeta de cupones de GoodRx.  - Si necesita que su receta se enve electrnicamente a una farmacia diferente, informe a nuestra oficina a travs de MyChart de [place] o por telfono llamando al [phone] y presione la opcin 4.

## 2023-07-27 ENCOUNTER — Encounter: Payer: Self-pay | Admitting: Dermatology

## 2024-06-10 ENCOUNTER — Ambulatory Visit: Admitting: Dermatology

## 2024-06-18 ENCOUNTER — Encounter: Payer: Self-pay | Admitting: Dermatology

## 2024-06-18 ENCOUNTER — Ambulatory Visit: Admitting: Dermatology

## 2024-06-18 DIAGNOSIS — D225 Melanocytic nevi of trunk: Secondary | ICD-10-CM | POA: Diagnosis not present

## 2024-06-18 DIAGNOSIS — L578 Other skin changes due to chronic exposure to nonionizing radiation: Secondary | ICD-10-CM | POA: Diagnosis not present

## 2024-06-18 DIAGNOSIS — D485 Neoplasm of uncertain behavior of skin: Secondary | ICD-10-CM | POA: Diagnosis not present

## 2024-06-18 DIAGNOSIS — D229 Melanocytic nevi, unspecified: Secondary | ICD-10-CM

## 2024-06-18 DIAGNOSIS — W908XXA Exposure to other nonionizing radiation, initial encounter: Secondary | ICD-10-CM | POA: Diagnosis not present

## 2024-06-18 DIAGNOSIS — L814 Other melanin hyperpigmentation: Secondary | ICD-10-CM | POA: Diagnosis not present

## 2024-06-18 DIAGNOSIS — D492 Neoplasm of unspecified behavior of bone, soft tissue, and skin: Secondary | ICD-10-CM

## 2024-06-18 DIAGNOSIS — Z86018 Personal history of other benign neoplasm: Secondary | ICD-10-CM

## 2024-06-18 DIAGNOSIS — Z1283 Encounter for screening for malignant neoplasm of skin: Secondary | ICD-10-CM

## 2024-06-18 DIAGNOSIS — L821 Other seborrheic keratosis: Secondary | ICD-10-CM | POA: Diagnosis not present

## 2024-06-18 NOTE — Progress Notes (Signed)
 Follow-Up Visit   Subjective  Austin Crawford is a 58 y.o. male who presents for the following: Skin Cancer Screening and Full Body Skin Exam hx of Dysplastic nevi  The patient presents for Total-Body Skin Exam (TBSE) for skin cancer screening and mole check. The patient has spots, moles and lesions to be evaluated, some may be new or changing and the patient may have concern these could be cancer.  The following portions of the chart were reviewed this encounter and updated as appropriate: medications, allergies, medical history  Review of Systems:  No other skin or systemic complaints except as noted in HPI or Assessment and Plan.  Objective  Well appearing patient in no apparent distress; mood and affect are within normal limits.  A full examination was performed including scalp, head, eyes, ears, nose, lips, neck, chest, axillae, abdomen, back, buttocks, bilateral upper extremities, bilateral lower extremities, hands, feet, fingers, toes, fingernails, and toenails. All findings within normal limits unless otherwise noted below.   Relevant physical exam findings are noted in the Assessment and Plan.  R epigastric  Right Upper Back 2.0cm lat to spine sup edge of scar 0.6cm brown macule  Right Upper Back 2.0cm lat to spine middle medial edge of scar 0.5cm brown macule   Assessment & Plan   SKIN CANCER SCREENING PERFORMED TODAY.  ACTINIC DAMAGE - Chronic condition, secondary to cumulative UV/sun exposure - diffuse scaly erythematous macules with underlying dyspigmentation - Recommend daily broad spectrum sunscreen SPF 30+ to sun-exposed areas, reapply every 2 hours as needed.  - Staying in the shade or wearing long sleeves, sun glasses (UVA+UVB protection) and wide brim hats (4-inch brim around the entire circumference of the hat) are also recommended for sun protection.  - Call for new or changing lesions.  LENTIGINES, SEBORRHEIC KERATOSES, HEMANGIOMAS - Benign normal skin  lesions - Benign-appearing - Call for any changes  MELANOCYTIC NEVI - Tan-brown and/or pink-flesh-colored symmetric macules and papules - Benign appearing on exam today - Observation - Call clinic for new or changing moles - Recommend daily use of broad spectrum spf 30+ sunscreen to sun-exposed areas.  - R epigastric superior brown macule 0.4cm - R epigastric inferior brown macule 0.3cm  HISTORY OF DYSPLASTIC NEVUS No evidence of recurrence today Recommend regular full body skin exams Recommend daily broad spectrum sunscreen SPF 30+ to sun-exposed areas, reapply every 2 hours as needed.  Call if any new or changing lesions are noted between office visits  -L of midline of epigastric, L mid to low back paraspinal, R upper back 3.0cm lat to spine, R upper back 2.0cm lat to spine (possible recurrence bx x 2 today)  NEOPLASM OF SKIN (2) Right Upper Back 2.0cm lat to spine sup edge of scar Epidermal / dermal shaving  Lesion diameter (cm):  0.6 Informed consent: discussed and consent obtained   Timeout: patient name, date of birth, surgical site, and procedure verified   Procedure prep:  Patient was prepped and draped in usual sterile fashion Prep type:  Isopropyl alcohol Anesthesia: the lesion was anesthetized in a standard fashion   Anesthetic:  1% lidocaine w/ epinephrine 1-100,000 buffered w/ 8.4% NaHCO3 Instrument used: flexible razor blade   Hemostasis achieved with: pressure, aluminum chloride and electrodesiccation   Outcome: patient tolerated procedure well   Post-procedure details: sterile dressing applied and wound care instructions given   Dressing type: bandage and bacitracin    Specimen 1 - Surgical pathology Differential Diagnosis: Recurrent Dysplastic nevus vs other  Check Margins: yes 0.6cm brown macule [phone], [phone] Right Upper Back 2.0cm lat to spine middle medial edge of scar Epidermal / dermal shaving  Lesion diameter (cm):  0.5 Informed  consent: discussed and consent obtained   Timeout: patient name, date of birth, surgical site, and procedure verified   Procedure prep:  Patient was prepped and draped in usual sterile fashion Prep type:  Isopropyl alcohol Anesthesia: the lesion was anesthetized in a standard fashion   Anesthetic:  1% lidocaine w/ epinephrine 1-100,000 buffered w/ 8.4% NaHCO3 Instrument used: flexible razor blade   Hemostasis achieved with: pressure, aluminum chloride and electrodesiccation   Outcome: patient tolerated procedure well   Post-procedure details: sterile dressing applied and wound care instructions given   Dressing type: bandage and bacitracin    Specimen 2 - Surgical pathology Differential Diagnosis: Recurrent Dysplastic Nevus vs other  Check Margins: yes 0.5cm brown macule 2 pieces [id number], [phone]  Return in about 1 year (around 06/18/2025) for TBSE, Hx of Dysplastic nevi.  I, Grayce Saunas, RMA, am acting as scribe for Alm Rhyme, MD .   Documentation: I have reviewed the above documentation for accuracy and completeness, and I agree with the above.  Alm Rhyme, MD

## 2024-06-18 NOTE — Patient Instructions (Addendum)

## 2024-06-25 LAB — SURGICAL PATHOLOGY

## 2024-06-26 ENCOUNTER — Encounter: Payer: Self-pay | Admitting: Dermatology

## 2024-06-26 ENCOUNTER — Ambulatory Visit: Payer: Self-pay | Admitting: Dermatology

## 2024-06-26 NOTE — Telephone Encounter (Addendum)
 Tried calling patient regarding bx results. No answer. LM for patient to return call.   ----- Message from Alm Rhyme sent at 06/26/2024  1:42 PM EDT ----- FINAL DIAGNOSIS        1. Skin, right upper back 2.0cm lat to spine sup edge of scar :       RECURRENT DYSPLASTIC NEVUS, LIMITED MARGINS FREE        2. Skin, right upper back 2.0cm lat to spine middle medial edge of scar :       SOLAR LENTIGO AND EARLY SEBORRHEIC KERATOSIS    1- Recurrent Dysplastic Recheck next visit 2- Benign freckle with associated benign keratosis No further treatment needed ----- Message ----- From: Interface, Lab In Three Zero One Sent: 06/25/2024   2:17 PM EDT To: Alm JAYSON Rhyme, MD

## 2024-07-02 ENCOUNTER — Other Ambulatory Visit: Payer: Self-pay | Admitting: Family Medicine

## 2024-07-02 DIAGNOSIS — E7800 Pure hypercholesterolemia, unspecified: Secondary | ICD-10-CM

## 2024-07-03 NOTE — Telephone Encounter (Signed)
 Left pt msg to call for bx results/sh

## 2024-07-03 NOTE — Telephone Encounter (Signed)
-----   Message from Alm Rhyme sent at 06/26/2024  1:42 PM EDT ----- FINAL DIAGNOSIS        1. Skin, right upper back 2.0cm lat to spine sup edge of scar :       RECURRENT DYSPLASTIC NEVUS, LIMITED MARGINS FREE        2. Skin, right upper back 2.0cm lat to spine middle medial edge of scar :       SOLAR LENTIGO AND EARLY SEBORRHEIC KERATOSIS    1- Recurrent Dysplastic Recheck next visit 2- Benign freckle with associated benign keratosis No further treatment needed ----- Message ----- From: Interface, Lab In Three Zero One Sent: 06/25/2024   2:17 PM EDT To: Alm JAYSON Rhyme, MD

## 2024-07-11 ENCOUNTER — Ambulatory Visit
Admission: RE | Admit: 2024-07-11 | Discharge: 2024-07-11 | Disposition: A | Discharge status: Home / Self Care | Payer: Self-pay | Source: Ambulatory Visit | Attending: Family Medicine | Admitting: Family Medicine

## 2024-07-11 DIAGNOSIS — E7800 Pure hypercholesterolemia, unspecified: Secondary | ICD-10-CM | POA: Insufficient documentation

## 2024-07-11 NOTE — Telephone Encounter (Addendum)
 Tried calling patient regarding results. No answer. LM for patient to return call.   ----- Message from Alm Rhyme sent at 06/26/2024  1:42 PM EDT ----- FINAL DIAGNOSIS        1. Skin, right upper back 2.0cm lat to spine sup edge of scar :       RECURRENT DYSPLASTIC NEVUS, LIMITED MARGINS FREE        2. Skin, right upper back 2.0cm lat to spine middle medial edge of scar :       SOLAR LENTIGO AND EARLY SEBORRHEIC KERATOSIS    1- Recurrent Dysplastic Recheck next visit 2- Benign freckle with associated benign keratosis No further treatment needed ----- Message ----- From: Interface, Lab In Three Zero One Sent: 06/25/2024   2:17 PM EDT To: Alm JAYSON Rhyme, MD

## 2024-07-15 ENCOUNTER — Encounter: Payer: Self-pay | Admitting: Dermatology

## 2024-07-15 NOTE — Telephone Encounter (Addendum)
 Called and discussed bx results with patient. He verbalized understanding and denied further questions.   ----- Message from Alm Rhyme sent at 06/26/2024  1:42 PM EDT ----- FINAL DIAGNOSIS        1. Skin, right upper back 2.0cm lat to spine sup edge of scar :       RECURRENT DYSPLASTIC NEVUS, LIMITED MARGINS FREE        2. Skin, right upper back 2.0cm lat to spine middle medial edge of scar :       SOLAR LENTIGO AND EARLY SEBORRHEIC KERATOSIS    1- Recurrent Dysplastic Recheck next visit 2- Benign freckle with associated benign keratosis No further treatment needed ----- Message ----- From: Interface, Lab In Three Zero One Sent: 06/25/2024   2:17 PM EDT To: Alm JAYSON Rhyme, MD

## 2024-07-25 ENCOUNTER — Ambulatory Visit: Payer: Commercial Managed Care - PPO | Admitting: Dermatology

## 2024-08-07 ENCOUNTER — Ambulatory Visit

## 2024-08-07 ENCOUNTER — Ambulatory Visit: Admitting: Podiatry

## 2024-08-07 DIAGNOSIS — M722 Plantar fascial fibromatosis: Secondary | ICD-10-CM | POA: Diagnosis not present

## 2024-08-07 MED ORDER — MELOXICAM 15 MG PO TABS
15.0000 mg | ORAL_TABLET | Freq: Every day | ORAL | 0 refills | Status: DC
Start: 2024-08-07 — End: 2024-09-03

## 2024-08-07 MED ORDER — METHYLPREDNISOLONE 4 MG PO TBPK: ORAL_TABLET | ORAL | 0 refills | Status: AC

## 2024-08-07 MED ORDER — TRIAMCINOLONE ACETONIDE 40 MG/ML IJ SUSP
20.0000 mg | Freq: Once | INTRAMUSCULAR | Status: AC
  Administered 2024-08-07: 20 mg

## 2024-08-08 NOTE — Progress Notes (Signed)
 Subjective:  Patient ID: Austin Crawford, male    DOB: 1966-02-14,  MRN: [id number] HPI Chief Complaint  Patient presents with   Foot Pain    NP - Left heel and ankle has pain    58 y.o. male presents with the above complaint.   ROS: Denies fever chills nausea mobic  muscle aches pains calf pain back pain chest pain shortness of breath.  Past Medical History:  Diagnosis Date   Dysplastic nevus 02/03/2021   moderate to severe - left midline of epigastric shave removal 07/15/21   Dysplastic nevus 02/03/2021   moderate to severe - right upper back 2.0 cm lateral to spine, shave removal 07/15/21   Dysplastic nevus 02/03/2021   moderate - left mid to low back paraspinal, repigmentation shave removal 07/15/21   Dysplastic nevus 02/03/2021   moderate - right umbilicus   Dysplastic nevus 07/15/2021   right upper back 3.0cm lat to spine - mod to severe   Dysplastic nevus 07/15/2021   right upper back 2.0cm lat to spine - residual  _ recurrent 06/21/2024 recheck at next follow up   Dysplastic nevus 07/15/2021   L midline of epigastric - recurrent, margins free   Dysplastic nevus 06/18/2024   right upper back 2 cm lateral to spine superior edge of scar - recurrent - will recheck follow up   Past Surgical History:  Procedure Laterality Date   shoudler Left     Current Outpatient Medications:    meloxicam  (MOBIC ) 15 MG tablet, Take 1 tablet (15 mg total) by mouth daily., Disp: 30 tablet, Rfl: 0   methylPREDNISolone  (MEDROL  DOSEPAK) 4 MG TBPK tablet, Take 6 tabs the 1st day, take 5 tabs the 2nd day, take 4 tabs the 3rd day, take 3 tabs the 4th day, take 2 tabs the 5th day, and take 1 tab the 6th day, Disp: 21 tablet, Rfl: 0   aspirin EC 81 MG tablet, Take by mouth., Disp: , Rfl:    atorvastatin (LIPITOR) 40 MG tablet, Take 1 tablet by mouth at bedtime. (Patient not taking: Reported on 06/18/2024), Disp: , Rfl:    celecoxib  (CELEBREX ) 200 MG capsule, Take 1 capsule (200 mg total) by mouth  daily. (Patient not taking: Reported on 02/03/2021), Disp: 30 capsule, Rfl: 2   cyclobenzaprine (FLEXERIL) 10 MG tablet, , Disp: , Rfl:    diclofenac  (VOLTAREN ) 75 MG EC tablet, Take 1 tablet (75 mg total) by mouth 2 (two) times daily. (Patient not taking: Reported on 02/03/2021), Disp: 60 tablet, Rfl: 3   ibuprofen  (ADVIL ,MOTRIN ) 800 MG tablet, Take 1 tablet (800 mg total) by mouth 3 (three) times daily. (Patient not taking: Reported on 02/03/2021), Disp: 30 tablet, Rfl: 0   ketoconazole (NIZORAL) 2 % cream, Apply topically daily., Disp: , Rfl:    mupirocin  ointment (BACTROBAN ) 2 %, Apply 1 application topically daily. Qd to biopsy sites, Disp: 22 g, Rfl: 2   rosuvastatin (CRESTOR) 5 MG tablet, Take 5 mg by mouth daily., Disp: , Rfl:    tiZANidine (ZANAFLEX) 2 MG tablet, TAKE 1 TABLET (2 MG TOTAL) BY MOUTH EVERY EIGHT (8) HOURS AS NEEDED FOR UP TO 7 DAYS., Disp: , Rfl:    traMADol  (ULTRAM ) 50 MG tablet, Take 1-2 tablets every 6 to 8 hours as needed for pain (Patient not taking: Reported on 02/03/2021), Disp: 30 tablet, Rfl: 0   triamcinolone  cream (KENALOG ) 0.1 %, Apply 1 application topically 2 (two) times daily. (Patient not taking: Reported on 02/03/2021), Disp: 30 g, Rfl: 0  No Known Allergies Review of Systems Objective:  There were no vitals filed for this visit.  General: Well developed, nourished, in no acute distress, alert and oriented x3   Dermatological: Skin is warm, dry and supple bilateral. Nails x 10 are well maintained; remaining integument appears unremarkable at this time. There are no open sores, no preulcerative lesions, no rash or signs of infection present.  Vascular: Dorsalis Pedis artery and Posterior Tibial artery pedal pulses are 2/4 bilateral with immedate capillary fill time. Pedal hair growth present. No varicosities and no lower extremity edema present bilateral.   Neruologic: Grossly intact via light touch bilateral. Vibratory intact via tuning fork bilateral.  Protective threshold with Semmes Wienstein monofilament intact to all pedal sites bilateral. Patellar and Achilles deep tendon reflexes 2+ bilateral. No Babinski or clonus noted bilateral.   Musculoskeletal: No gross boney pedal deformities bilateral. No pain, crepitus, or limitation noted with foot and ankle range of motion bilateral. Muscular strength 5/5 in all groups tested bilateral.  Pain on palpation medial calcaneal tubercle of the left heel.  He has no pain on medial-lateral compression of the calcaneus.  Gait: Unassisted, Nonantalgic.    Radiographs:  Radiographs taken today left foot demonstrate osseously mature individual no osteoarthritic changes.  No spurring.  Soft tissue increase in density plantar fascial canny insertion site appears to be acute in nature.  Assessment & Plan:   Assessment: Plan fasciitis left foot.  Plan: Discussed etiology pathology conservative versus surgical therapies injected his left heel today 20 mg Kenalog  5 mg Marcaine for maximal tenderness.  Tolerated procedure well without complications.  Was given both oral written by instructions.  He was also provided prescription for methylprednisolone  and meloxicam  as well as a plantar fascia brace.  Follow-up with him in 1 month     Saadiq Poche T. [place], [place]

## 2024-09-03 ENCOUNTER — Other Ambulatory Visit: Payer: Self-pay | Admitting: Podiatry

## 2024-09-28 ENCOUNTER — Other Ambulatory Visit: Payer: Self-pay | Admitting: Podiatry

## 2025-06-18 ENCOUNTER — Ambulatory Visit: Admitting: Dermatology
